# Patient Record
Sex: Female | Born: 1990 | Race: White | Hispanic: No | Marital: Married | Smoking: Never smoker
Health system: Southern US, Community
[De-identification: ages and names within clinical notes are randomized; demographics above are authoritative.]

## PROBLEM LIST (undated history)

## (undated) ENCOUNTER — Inpatient Hospital Stay (HOSPITAL_COMMUNITY): Payer: Self-pay

## (undated) DIAGNOSIS — Q751 Craniofacial dysostosis: Secondary | ICD-10-CM

## (undated) DIAGNOSIS — R519 Headache, unspecified: Secondary | ICD-10-CM

## (undated) DIAGNOSIS — R87629 Unspecified abnormal cytological findings in specimens from vagina: Secondary | ICD-10-CM

## (undated) DIAGNOSIS — R51 Headache: Secondary | ICD-10-CM

## (undated) HISTORY — PX: NO PAST SURGERIES: SHX2092

## (undated) HISTORY — PX: WISDOM TOOTH EXTRACTION: SHX21

## (undated) HISTORY — PX: OTHER SURGICAL HISTORY: SHX169

---

## 2014-07-06 ENCOUNTER — Ambulatory Visit (HOSPITAL_COMMUNITY): Payer: Self-pay

## 2014-11-05 ENCOUNTER — Emergency Department (HOSPITAL_BASED_OUTPATIENT_CLINIC_OR_DEPARTMENT_OTHER): Payer: BLUE CROSS/BLUE SHIELD

## 2014-11-05 ENCOUNTER — Emergency Department (HOSPITAL_BASED_OUTPATIENT_CLINIC_OR_DEPARTMENT_OTHER)
Admission: EM | Admit: 2014-11-05 | Discharge: 2014-11-05 | Disposition: A | Discharge status: Home / Self Care | Payer: BLUE CROSS/BLUE SHIELD | Attending: Emergency Medicine | Admitting: Emergency Medicine

## 2014-11-05 ENCOUNTER — Encounter (HOSPITAL_BASED_OUTPATIENT_CLINIC_OR_DEPARTMENT_OTHER): Payer: Self-pay | Admitting: *Deleted

## 2014-11-05 DIAGNOSIS — Y998 Other external cause status: Secondary | ICD-10-CM | POA: Insufficient documentation

## 2014-11-05 DIAGNOSIS — Y9289 Other specified places as the place of occurrence of the external cause: Secondary | ICD-10-CM | POA: Insufficient documentation

## 2014-11-05 DIAGNOSIS — R55 Syncope and collapse: Secondary | ICD-10-CM

## 2014-11-05 DIAGNOSIS — S82844A Nondisplaced bimalleolar fracture of right lower leg, initial encounter for closed fracture: Secondary | ICD-10-CM | POA: Insufficient documentation

## 2014-11-05 DIAGNOSIS — S82891A Other fracture of right lower leg, initial encounter for closed fracture: Secondary | ICD-10-CM

## 2014-11-05 DIAGNOSIS — Y9389 Activity, other specified: Secondary | ICD-10-CM | POA: Insufficient documentation

## 2014-11-05 DIAGNOSIS — W1839XA Other fall on same level, initial encounter: Secondary | ICD-10-CM | POA: Insufficient documentation

## 2014-11-05 LAB — I-STAT CHEM 8, ED
BUN: 14 mg/dL (ref 6–20)
Calcium, Ion: 1.2 mmol/L (ref 1.12–1.23)
Chloride: 103 mmol/L (ref 101–111)
Creatinine, Ser: 0.8 mg/dL (ref 0.44–1.00)
Glucose, Bld: 96 mg/dL (ref 65–99)
HEMATOCRIT: 33 % — AB (ref 36.0–46.0)
HEMOGLOBIN: 11.2 g/dL — AB (ref 12.0–15.0)
Potassium: 3.7 mmol/L (ref 3.5–5.1)
Sodium: 140 mmol/L (ref 135–145)
TCO2: 24 mmol/L (ref 0–100)

## 2014-11-05 MED ORDER — HYDROCODONE-ACETAMINOPHEN 5-325 MG PO TABS
1.0000 | ORAL_TABLET | Freq: Once | ORAL | Status: AC
  Administered 2014-11-05: 1 via ORAL
  Filled 2014-11-05: qty 1

## 2014-11-05 MED ORDER — IBUPROFEN 400 MG PO TABS
400.0000 mg | ORAL_TABLET | Freq: Once | ORAL | Status: AC
  Administered 2014-11-05: 400 mg via ORAL
  Filled 2014-11-05 (×2): qty 1

## 2014-11-05 MED ORDER — HYDROCODONE-ACETAMINOPHEN 5-325 MG PO TABS: 1.0000 | ORAL_TABLET | Freq: Four times a day (QID) | ORAL | Status: DC | PRN

## 2014-11-05 NOTE — Discharge Instructions (Signed)
If you were given medicines take as directed.  If you are on coumadin or contraceptives realize their levels and effectiveness is altered by many different medicines.  If you have any reaction (rash, tongues swelling, other) to the medicines stop taking and see a physician.   Non weight bearing.   If your blood pressure was elevated in the ER make sure you follow up for management with a primary doctor or return for chest pain, shortness of breath or stroke symptoms.  Please follow up as directed and return to the ER or see a physician for new or worsening symptoms.  Thank you. Filed Vitals:   11/05/14 1733  BP: 108/67  Pulse: 84  Temp: 97.6 F (36.4 C)  TempSrc: Oral  Resp: 18  Height:  (1.702 m)  Weight: 135 lb (61.236 kg)  SpO2: 98%    Ankle Fracture A fracture is a break in a bone. A cast or splint may be used to protect the ankle and heal the break. Sometimes, surgery is needed. HOME CARE  Use crutches as told by your doctor. It is very important that you use your crutches correctly.  Do not put weight or pressure on the injured ankle until told by your doctor.  Keep your ankle raised (elevated) when sitting or lying down.  Apply ice to the ankle:  Put ice in a plastic bag.  Place a towel between your cast and the bag.  Leave the ice on for 20 minutes, 2-3 times a day.  If you have a plaster or fiberglass cast:  Do not try to scratch under the cast with any objects.  Check the skin around the cast every day. You may put lotion on red or sore areas.  Keep your cast dry and clean.  If you have a plaster splint:  Wear the splint as told by your doctor.  You can loosen the elastic around the splint if your toes get numb, tingle, or turn cold or blue.  Do not put pressure on any part of your cast or splint. It may break. Rest your plaster splint or cast only on a pillow the first 24 hours until it is fully hardened.  Cover your cast or splint with a plastic  bag during showers.  Do not lower your cast or splint into water.  Take medicine as told by your doctor.  Do not drive until your doctor says it is safe.  Follow-up with your doctor as told. It is very important that you go to your follow-up visits. GET HELP IF: The swelling and discomfort gets worse.  GET HELP RIGHT AWAY IF:   Your splint or cast breaks.  You continue to have very bad pain.  You have new pain or swelling after your splint or cast was put on.  Your skin or toes below the injured ankle:  Turn blue or gray.  Feel cold, numb, or you cannot feel them.  There is a bad smell or yellowish white fluid (pus) coming from under the splint or cast. MAKE SURE YOU:   Understand these instructions.  Will watch your condition.  Will get help right away if you are not doing well or get worse. Document Released: 01/19/2009 Document Revised: 01/12/2013 Document Reviewed: 10/21/2012 [facility] Patient Information 2015 [place], [place]. This information is not intended to replace advice given to you by your health care provider. Make sure you discuss any questions you have with your health care provider.

## 2014-11-05 NOTE — ED Notes (Signed)
Pt reports being outside are [employer] all day, feeling nauseated and then having a syncopal episode.  Pt ambulatory at this time.  Reports that she fell because she was dizzy, twisting her right ankle.  Significant swelling noted to ankle.  Pulses found with doppler

## 2014-11-05 NOTE — ED Provider Notes (Signed)
CSN: [id number]     Arrival date & time 11/05/14  1715 History  This chart was scribed for Blane Ohara, MD by Budd Palmer, ED Scribe. This patient was seen in room MHT13/MHT13 and the patient's care was started at 5:56 PM.    Chief Complaint  Patient presents with  . Near Syncope   The history is provided by the patient. No language interpreter was used.   HPI Comments: Latoya Martin is a 24 y.o. female who presents to the Emergency Department complaining of near syncope earlier today. She was at [employer] all day feeling a little nauseous with the urge to go to the bathroom when she passed out and woke back up on the ground. She notes associated right ankle pain and swelling. She has since been drinking a lot of water and Powerade. She was unable to walk more than 15 steps because she felt as though she might loose consciousness again. She has never had any previous episodes of these Sx. She is currently on her period. She denies a PMHx of heart issues and DVT. She denies a FHx of sudden death at a young age.   History reviewed. No pertinent past medical history. History reviewed. No pertinent past surgical history. History reviewed. No pertinent family history. History  Substance Use Topics  . Smoking status: Never Smoker   . Smokeless tobacco: Not on file  . Alcohol Use: No   OB History    No data available     Review of Systems  All other systems reviewed and are negative.   Allergies  Review of patient's allergies indicates no known allergies.  Home Medications   Prior to Admission medications   Not on File   BP 108/67 mmHg  Pulse 84  Temp(Src) 97.6 F (36.4 C) (Oral)  Resp 18  Ht  (1.702 m)  Wt 135 lb (61.236 kg)  BMI 21.14 kg/m2  SpO2 98%  LMP 10/05/2014 Physical Exam  Constitutional: She is oriented to person, place, and time. She appears well-developed and well-nourished. No distress.  HENT:  Head: Normocephalic and atraumatic.  Mouth/Throat:  Oropharynx is clear and moist.  Eyes: Conjunctivae and EOM are normal. Pupils are equal, round, and reactive to light.  Neck: Normal range of motion. Neck supple. No tracheal deviation present.  Cardiovascular: Normal rate and regular rhythm.   No murmur heard. 2+ UE pulses  Pulmonary/Chest: Breath sounds normal. No respiratory distress.  Abdominal: Soft.  Musculoskeletal: Normal range of motion.  Moderate swelling and TTP on R ankle.  Neurological: She is alert and oriented to person, place, and time.  Skin: Skin is warm and dry.  Psychiatric: She has a normal mood and affect. Her behavior is normal.  Nursing note and vitals reviewed.   ED Course  Procedures  DIAGNOSTIC STUDIES: Oxygen Saturation is 98% on RA, normal by my interpretation.    COORDINATION OF CARE: 6:02 PM - Discussed plans to wait on diagnostic studies and reassess. Pt advised of plan for treatment and pt agrees.  Labs Review Labs Reviewed  I-STAT CHEM 8, ED - Abnormal; Notable for the following:    Hemoglobin 11.2 (*)    HCT 33.0 (*)    All other components within normal limits  POC URINE PREG, ED    Imaging Review Dg Ankle Complete Right  11/05/2014   CLINICAL DATA:  Fall injuring the RIGHT ankle. Medial and lateral ankle pain with swelling.  EXAM: RIGHT ANKLE - COMPLETE 3+ VIEW  COMPARISON:  None.  FINDINGS: The ankle mortise remains congruent. Talar dome is intact. There is a nondisplaced oblique fracture of the distal fibular metaphysis best seen on the frontal view. Additionally, there is a nondisplaced posterior malleolar fracture evident on the lateral view.  13 mm bone fragment is present over the lateral tibial plafond on the lateral projection, with defect in the lateral tibial plafond.  IMPRESSION: 1. Nondisplaced distal fibular metaphysis fracture. 2. Nondisplaced posterior malleolus fracture. 3. Lateral tibial plafond fracture displaced anteriorly over the joint. A noncontrast CT the ankle should be  considered for further assessment and operative planning.   Electronically Signed   By: Andreas Newport M.D.   On: 11/05/2014 17:55   Ct Ankle Right Wo Contrast  11/05/2014   CLINICAL DATA:  Syncopal episode with fall twisting the right ankle.  EXAM: CT OF THE RIGHT ANKLE WITHOUT CONTRAST  TECHNIQUE: Multidetector CT imaging of the right ankle was performed according to the standard protocol. Multiplanar CT image reconstructions were also generated.  COMPARISON:  Plain films earlier today.  FINDINGS: Examination demonstrates diffuse subcutaneous edema over the ankle.  There is a oblique fracture of the distal fibular metaphysis without significant displacement. There is a displaced fracture of the anterior aspect of the lateral tibial plafond with 2 adjacent displaced fragments anterior laterally measuring 8 and 12 mm respectively. There is a posterior malleolar fracture without significant displacement. There is also a subtle cortical fracture along the superior aspect of the medial malleolus without significant displacement. Os trigonum is present.  There are degenerative changes over the calcaneonavicular joint. Remainder the exam is within normal.  IMPRESSION: Oblique fracture of the distal fibular metaphysis without significant displacement. Posterior malleolar fracture without significant displacement. Superficial cortical fracture of the superior aspect of the medial malleolus without significant displacement. Displaced chip fracture from the anterior aspect of the lateral tibial plafond with 2 adjacent anterior laterally displaced 8 mm and 12 mm fragments. Associated subcutaneous edema.   Electronically Signed   By: Elberta Fortis M.D.   On: 11/05/2014 18:36     EKG Interpretation None      MDM   Final diagnoses:  Syncope and collapse  Closed right ankle fracture, initial encounter   Patient presented after syncopal episode, nauseated and had sense of urination prior. Likely vasovagal however  discussed close outpatient follow-up and if recurrence patient will need a Holter monitor. No classic blood clot risk factors. X-ray reviewed showing ankle fracture, CT ordered for further delineation to help outpatient surgery. Splint placed in the ER. Pain controlled in the ER.  Results and differential diagnosis were discussed with the patient/parent/guardian. Xrays were independently reviewed by myself.  Close follow up outpatient was discussed, comfortable with the plan.   Medications - No data to display  Filed Vitals:   11/05/14 1733  BP: 108/67  Pulse: 84  Temp: 97.6 F (36.4 C)  TempSrc: Oral  Resp: 18  Height: 5\' 7"  (1.702 m)  Weight: 135 lb (61.236 kg)  SpO2: 98%    Final diagnoses:  Syncope and collapse  Closed right ankle fracture, initial encounter       Blane Ohara, MD 11/05/14 [phone]

## 2015-04-04 LAB — OB RESULTS CONSOLE GC/CHLAMYDIA
Chlamydia: NEGATIVE
GC PROBE AMP, GENITAL: NEGATIVE

## 2015-04-04 LAB — OB RESULTS CONSOLE ANTIBODY SCREEN: ANTIBODY SCREEN: NEGATIVE

## 2015-04-04 LAB — OB RESULTS CONSOLE ABO/RH: RH Type: POSITIVE

## 2015-04-04 LAB — OB RESULTS CONSOLE RPR: RPR: NONREACTIVE

## 2015-04-04 LAB — OB RESULTS CONSOLE RUBELLA ANTIBODY, IGM: Rubella: NON-IMMUNE/NOT IMMUNE

## 2015-04-04 LAB — OB RESULTS CONSOLE HIV ANTIBODY (ROUTINE TESTING): HIV: NONREACTIVE

## 2015-04-04 LAB — OB RESULTS CONSOLE HEPATITIS B SURFACE ANTIGEN: HEP B S AG: NEGATIVE

## 2015-04-04 LAB — OB RESULTS CONSOLE GBS: GBS: NEGATIVE

## 2015-07-30 DIAGNOSIS — D509 Iron deficiency anemia, unspecified: Secondary | ICD-10-CM | POA: Diagnosis not present

## 2015-07-30 DIAGNOSIS — O4402 Placenta previa specified as without hemorrhage, second trimester: Secondary | ICD-10-CM | POA: Diagnosis not present

## 2015-07-30 DIAGNOSIS — Z3A26 26 weeks gestation of pregnancy: Secondary | ICD-10-CM | POA: Diagnosis not present

## 2015-07-30 DIAGNOSIS — Z36 Encounter for antenatal screening of mother: Secondary | ICD-10-CM | POA: Diagnosis not present

## 2015-07-30 DIAGNOSIS — O358XX0 Maternal care for other (suspected) fetal abnormality and damage, not applicable or unspecified: Secondary | ICD-10-CM | POA: Diagnosis not present

## 2015-08-06 DIAGNOSIS — O99810 Abnormal glucose complicating pregnancy: Secondary | ICD-10-CM | POA: Diagnosis not present

## 2015-08-06 DIAGNOSIS — Z3A27 27 weeks gestation of pregnancy: Secondary | ICD-10-CM | POA: Diagnosis not present

## 2015-08-13 DIAGNOSIS — D509 Iron deficiency anemia, unspecified: Secondary | ICD-10-CM | POA: Diagnosis not present

## 2015-09-10 DIAGNOSIS — O4403 Placenta previa specified as without hemorrhage, third trimester: Secondary | ICD-10-CM | POA: Diagnosis not present

## 2015-09-10 DIAGNOSIS — Z3A32 32 weeks gestation of pregnancy: Secondary | ICD-10-CM | POA: Diagnosis not present

## 2015-09-12 ENCOUNTER — Inpatient Hospital Stay (HOSPITAL_COMMUNITY)
Admission: AD | Admit: 2015-09-12 | Discharge: 2015-09-12 | Disposition: A | Discharge status: Home / Self Care | Payer: BLUE CROSS/BLUE SHIELD | Source: Ambulatory Visit | Attending: Obstetrics & Gynecology | Admitting: Obstetrics & Gynecology

## 2015-09-12 ENCOUNTER — Encounter (HOSPITAL_COMMUNITY): Payer: Self-pay | Admitting: *Deleted

## 2015-09-12 DIAGNOSIS — Z3A32 32 weeks gestation of pregnancy: Secondary | ICD-10-CM | POA: Insufficient documentation

## 2015-09-12 DIAGNOSIS — O26893 Other specified pregnancy related conditions, third trimester: Secondary | ICD-10-CM | POA: Diagnosis not present

## 2015-09-12 DIAGNOSIS — O219 Vomiting of pregnancy, unspecified: Secondary | ICD-10-CM

## 2015-09-12 DIAGNOSIS — R51 Headache: Secondary | ICD-10-CM | POA: Insufficient documentation

## 2015-09-12 DIAGNOSIS — O9989 Other specified diseases and conditions complicating pregnancy, childbirth and the puerperium: Secondary | ICD-10-CM | POA: Diagnosis not present

## 2015-09-12 DIAGNOSIS — R1011 Right upper quadrant pain: Secondary | ICD-10-CM | POA: Insufficient documentation

## 2015-09-12 DIAGNOSIS — O210 Mild hyperemesis gravidarum: Secondary | ICD-10-CM | POA: Diagnosis not present

## 2015-09-12 HISTORY — DX: Craniofacial dysostosis: Q75.1

## 2015-09-12 LAB — COMPREHENSIVE METABOLIC PANEL
ALK PHOS: 107 U/L (ref 38–126)
ALT: 17 U/L (ref 14–54)
ANION GAP: 7 (ref 5–15)
AST: 19 U/L (ref 15–41)
Albumin: 2.9 g/dL — ABNORMAL LOW (ref 3.5–5.0)
BUN: 8 mg/dL (ref 6–20)
CO2: 20 mmol/L — AB (ref 22–32)
Calcium: 8.2 mg/dL — ABNORMAL LOW (ref 8.9–10.3)
Chloride: 108 mmol/L (ref 101–111)
Creatinine, Ser: 0.49 mg/dL (ref 0.44–1.00)
GFR calc Af Amer: >60 mL/min (ref >60)
GFR calc non Af Amer: >60 mL/min (ref >60)
Glucose, Bld: 107 mg/dL — ABNORMAL HIGH (ref 65–99)
POTASSIUM: 3.7 mmol/L (ref 3.5–5.1)
SODIUM: 135 mmol/L (ref 135–145)
Total Bilirubin: 0.5 mg/dL (ref 0.3–1.2)
Total Protein: 6.4 g/dL — ABNORMAL LOW (ref 6.5–8.1)

## 2015-09-12 LAB — URINALYSIS, ROUTINE W REFLEX MICROSCOPIC
Bilirubin Urine: NEGATIVE
GLUCOSE, UA: NEGATIVE mg/dL
Hgb urine dipstick: NEGATIVE
Ketones, ur: NEGATIVE mg/dL
LEUKOCYTES UA: NEGATIVE
Nitrite: NEGATIVE
PROTEIN: NEGATIVE mg/dL
Specific Gravity, Urine: 1.01 (ref 1.005–1.030)
pH: 7 (ref 5.0–8.0)

## 2015-09-12 LAB — WET PREP, GENITAL
CLUE CELLS WET PREP: NONE SEEN
Trich, Wet Prep: NONE SEEN
Yeast Wet Prep HPF POC: NONE SEEN

## 2015-09-12 LAB — CBC
HEMATOCRIT: 30.6 % — AB (ref 36.0–46.0)
HEMOGLOBIN: 10.5 g/dL — AB (ref 12.0–15.0)
MCH: 30.1 pg (ref 26.0–34.0)
MCHC: 34.3 g/dL (ref 30.0–36.0)
MCV: 87.7 fL (ref 78.0–100.0)
Platelets: 157 10*3/uL (ref 150–400)
RBC: 3.49 MIL/uL — ABNORMAL LOW (ref 3.87–5.11)
RDW: 14.2 % (ref 11.5–15.5)
WBC: 10.3 10*3/uL (ref 4.0–10.5)

## 2015-09-12 MED ORDER — ACETAMINOPHEN 325 MG PO TABS
650.0000 mg | ORAL_TABLET | Freq: Once | ORAL | Status: AC
  Administered 2015-09-12: 650 mg via ORAL
  Filled 2015-09-12: qty 2

## 2015-09-12 MED ORDER — HYDROCODONE-ACETAMINOPHEN 5-325 MG PO TABS: 1.0000 | ORAL_TABLET | ORAL | Status: DC | PRN

## 2015-09-12 MED ORDER — ONDANSETRON 4 MG PO TBDP: 8.0000 mg | ORAL_TABLET | Freq: Three times a day (TID) | ORAL | Status: DC | PRN

## 2015-09-12 NOTE — MAU Note (Signed)
Around 2330, woke up out of deep sleep, felt really bad.  Vomited.  Started having pain in RUQ, felt like ribs were "on fire".,  At 7 this morning, was sick again.  Called office x2, finally heard back, was told to come here.  appt on Mon, ? AFI was 9.. Has a pounding headache; denies visual changes or increased swelling.+

## 2015-09-12 NOTE — MAU Provider Note (Signed)
History     CSN: [id number]  Arrival date and time: 09/12/15 1439   First Provider Initiated Contact with Patient 09/12/15 1539      Chief Complaint  Patient presents with  . Abdominal Pain  . Headache   HPI  Latoya Martin is a 25 y.o. G1P0 at [redacted]w[redacted]d who presents for RUQ, n/v, headache, & vaginal discharge.  Reports RUQ pain just under her ribs x 2 weeks. Discussed with her ob on Monday who told her it was "stretching pain". Pain worsened last night & was associated with nausea & vomiting. Reports vomiting until 7 am this morning, multiple times. Was able to eat a sandwich for lunch. Rates pain 8/10. Describes as constant burning. Pain worse with palpation & deep breaths. Is not currently nauseated. Last night ate fried fish, french fries, and birthday cake.  Headache since last night. Rates 5/10. Has not treated.  Reports increase in thin discharge x 2 days and lower abdominal pressure.  Had intercourse this morning.  Denies vaginal bleeding or contractions.  Positive fetal movement.   OB History    Gravida Para Term Preterm AB TAB SAB Ectopic Multiple Living   1               Past Medical History  Diagnosis Date  . Crouzon syndrome     Past Surgical History  Procedure Laterality Date  . Head surgery       crouzon syndrome  . Wisdom tooth extraction      History reviewed. No pertinent family history.  Social History  Substance Use Topics  . Smoking status: Never Smoker   . Smokeless tobacco: None  . Alcohol Use: No    Allergies: No Known Allergies  Prescriptions prior to admission  Medication Sig Dispense Refill Last Dose  . Prenatal Vit-Fe Fumarate-FA (MULTIVITAMIN-PRENATAL) 27-0.8 MG TABS tablet Take 1 tablet by mouth daily at 12 noon.   09/11/2015 at Unknown time    Review of Systems  Constitutional: Negative.   Eyes: Negative for blurred vision.  Respiratory: Negative.   Cardiovascular: Negative.   Gastrointestinal: Positive for nausea, vomiting and  abdominal pain. Negative for heartburn, diarrhea and constipation.  Genitourinary: Negative for dysuria.       + vaginal discharge vs LOF  Neurological: Positive for headaches.   Physical Exam   Blood pressure 112/69, pulse 102, temperature 98.1 F (36.7 C), temperature source Oral, resp. rate 20, weight 173 lb 3.2 oz (78.563 kg), last menstrual period 10/05/2014. Patient Vitals for the past 24 hrs:  BP Temp Temp src Pulse Resp Weight  09/12/15 1721 116/62 mmHg 97.7 F (36.5 C) Oral 86 18 -  09/12/15 1530 108/68 mmHg - - 94 - -  09/12/15 1524 111/65 mmHg - - 99 - -  09/12/15 1508 112/69 mmHg 98.1 F (36.7 C) Oral 102 20 173 lb 3.2 oz (78.563 kg)     Physical Exam  Nursing note and vitals reviewed. Constitutional: She is oriented to person, place, and time. She appears well-developed and well-nourished. No distress.  HENT:  Head: Normocephalic and atraumatic.  Eyes: Conjunctivae are normal. Right eye exhibits no discharge. Left eye exhibits no discharge. No scleral icterus.  Neck: Normal range of motion.  Cardiovascular: Normal rate, regular rhythm and normal heart sounds.   No murmur heard. Respiratory: Effort normal and breath sounds normal. No respiratory distress. She has no wheezes.  GI: Soft. Bowel sounds are normal. There is tenderness. There is positive Murphy's sign. There is no guarding.  Genitourinary: Vaginal discharge (moderate amount of thin white discharge) found.  No pooling  Neurological: She is alert and oriented to person, place, and time.  Skin: Skin is warm and dry. She is not diaphoretic.  Psychiatric: She has a normal mood and affect. Her behavior is normal. Judgment and thought content normal.   Dilation: Closed Effacement (%): Thick Exam by:: Judeth HornErin Puneet Masoner NP   Fetal Tracing:  Baseline: 145 Variability: moderate Accelerations: 15x15 Decelerations: none  Toco: none   MAU Course  Procedures Results for orders placed or performed during the  hospital encounter of 09/12/15 (from the past 24 hour(s))  Urinalysis, Routine w reflex microscopic (not at [facility])     Status: None   Collection Time: 09/12/15  3:10 PM  Result Value Ref Range   Color, Urine YELLOW YELLOW   APPearance CLEAR CLEAR   Specific Gravity, Urine 1.010 1.005 - 1.030   pH 7.0 5.0 - 8.0   Glucose, UA NEGATIVE NEGATIVE mg/dL   Hgb urine dipstick NEGATIVE NEGATIVE   Bilirubin Urine NEGATIVE NEGATIVE   Ketones, ur NEGATIVE NEGATIVE mg/dL   Protein, ur NEGATIVE NEGATIVE mg/dL   Nitrite NEGATIVE NEGATIVE   Leukocytes, UA NEGATIVE NEGATIVE  CBC     Status: Abnormal   Collection Time: 09/12/15  3:25 PM  Result Value Ref Range   WBC 10.3 4.0 - 10.5 K/uL   RBC 3.49 (L) 3.87 - 5.11 MIL/uL   Hemoglobin 10.5 (L) 12.0 - 15.0 g/dL   HCT 65.730.6 (L) 84.636.0 - 96.246.0 %   MCV 87.7 78.0 - 100.0 fL   MCH 30.1 26.0 - 34.0 pg   MCHC 34.3 30.0 - 36.0 g/dL   RDW 95.214.2 84.111.5 - 32.415.5 %   Platelets 157 150 - 400 K/uL  Comprehensive metabolic panel     Status: Abnormal   Collection Time: 09/12/15  3:25 PM  Result Value Ref Range   Sodium 135 135 - 145 mmol/L   Potassium 3.7 3.5 - 5.1 mmol/L   Chloride 108 101 - 111 mmol/L   CO2 20 (L) 22 - 32 mmol/L   Glucose, Bld 107 (H) 65 - 99 mg/dL   BUN 8 6 - 20 mg/dL   Creatinine, Ser 4.010.49 0.44 - 1.00 mg/dL   Calcium 8.2 (L) 8.9 - 10.3 mg/dL   Total Protein 6.4 (L) 6.5 - 8.1 g/dL   Albumin 2.9 (L) 3.5 - 5.0 g/dL   AST 19 15 - 41 U/L   ALT 17 14 - 54 U/L   Alkaline Phosphatase 107 38 - 126 U/L   Total Bilirubin 0.5 0.3 - 1.2 mg/dL   GFR calc non Af Amer >60 >60 mL/min   GFR calc Af Amer >60 >60 mL/min   Anion gap 7 5 - 15  Wet prep, genital     Status: Abnormal   Collection Time: 09/12/15  3:57 PM  Result Value Ref Range   Yeast Wet Prep HPF POC NONE SEEN NONE SEEN   Trich, Wet Prep NONE SEEN NONE SEEN   Clue Cells Wet Prep HPF POC NONE SEEN NONE SEEN   WBC, Wet Prep HPF POC MODERATE (A) NONE SEEN   Sperm PRESENT     MDM Reactive  tracing, no contractions, cervix closed No pooling, fern negative BPs normal Tylenol 650 mg PO -- reports resolution of headache -- continues to have RUQ pain CBC, CMP -- wnl S/w Dr. Langston MaskerMorris. No evidence of acute or emergent condition. Ok to discharge home. Can rx vicodin for pain.  Assessment and Plan  A: 1. RUQ abdominal pain   2. Nausea/vomiting in pregnancy     P: Discharge home Rx vicodin & zofran Discussed reasons to return to MAU Keep f/u with ob Decrease high fat & fried foods in diet  Judeth Horn 09/12/2015, 3:29 PM

## 2015-09-12 NOTE — MAU Note (Signed)
Pt put back in the system by error, pain score just restated

## 2015-09-12 NOTE — Discharge Instructions (Signed)
Low-Fat Diet for Pancreatitis or Gallbladder Conditions A low-fat diet can be helpful if you have pancreatitis or a gallbladder condition. With these conditions, your pancreas and gallbladder have trouble digesting fats. A healthy eating plan with less fat will help rest your pancreas and gallbladder and reduce your symptoms. WHAT DO I NEED TO KNOW ABOUT THIS DIET?  Eat a low-fat diet.  Reduce your fat intake to less than 20-30% of your total daily calories. This is less than 50-60 g of fat per day.  Remember that you need some fat in your diet. Ask your dietician what your daily goal should be.  Choose nonfat and low-fat healthy foods. Look for the words "nonfat," "low fat," or "fat free."  As a guide, look on the label and choose foods with less than 3 g of fat per serving. Eat only one serving.  Avoid alcohol.  Do not smoke. If you need help quitting, talk with your health care provider.  Eat small frequent meals instead of three large heavy meals. WHAT FOODS CAN I EAT? Grains Include healthy grains and starches such as potatoes, wheat bread, fiber-rich cereal, and brown rice. Choose whole grain options whenever possible. In adults, whole grains should account for 45-65% of your daily calories.  Fruits and Vegetables Eat plenty of fruits and vegetables. Fresh fruits and vegetables add fiber to your diet. Meats and Other Protein Sources Eat lean meat such as chicken and pork. Trim any fat off of meat before cooking it. Eggs, fish, and beans are other sources of protein. In adults, these foods should account for 10-35% of your daily calories. Dairy Choose low-fat milk and dairy options. Dairy includes fat and protein, as well as calcium.  Fats and Oils Limit high-fat foods such as fried foods, sweets, baked goods, sugary drinks.  Other Creamy sauces and condiments, such as mayonnaise, can add extra fat. Think about whether or not you need to use them, or use smaller amounts or low fat  options. WHAT FOODS ARE NOT RECOMMENDED?  High fat foods, such as:  [employer].  Ice cream.  [place] toast.  Sweet rolls.  Pizza.  Cheese bread.  Foods covered with batter, butter, creamy sauces, or cheese.  Fried foods.  Sugary drinks and desserts.  Foods that cause gas or bloating   This information is not intended to replace advice given to you by your health care provider. Make sure you discuss any questions you have with your health care provider.   Document Released: 03/29/2013 Document Reviewed: 03/29/2013 Elsevier Interactive Patient Education [employer] Preterm Labor Information Preterm labor is when labor starts before you are [redacted] weeks pregnant. The normal length of pregnancy is 39 to 41 weeks.  CAUSES  The cause of preterm labor is not often known. The most common known cause is infection. RISK FACTORS  Having a history of preterm labor.  Having your water break before it should.  Having a placenta that covers the opening of the cervix.  Having a placenta that breaks away from the uterus.  Having a cervix that is too weak to hold the baby in the uterus.  Having too much fluid in the amniotic sac.  Taking drugs or smoking while pregnant.  Not gaining enough weight while pregnant.  Being younger than 32 and older than 26 years old.  Having a low income.  Being African American. SYMPTOMS  Period-like cramps, belly (abdominal) pain, or back pain.  Contractions that are regular, as often as six  in an hour. They may be mild or painful.  Contractions that start at the top of the belly. They then move to the lower belly and back.  Lower belly pressure that seems to get stronger.  Bleeding from the vagina.  Fluid leaking from the vagina. TREATMENT  Treatment depends on:  Your condition.  The condition of your baby.  How many weeks pregnant you are. Your doctor may have you:  Take medicine to stop contractions.  Stay in bed  except to use the restroom (bed rest).  Stay in the hospital. WHAT SHOULD YOU DO IF YOU THINK YOU ARE IN PRETERM LABOR? Call your doctor right away. You need to go to the hospital right away.  HOW CAN YOU PREVENT PRETERM LABOR IN FUTURE PREGNANCIES?  Stop smoking, if you smoke.  Maintain healthy weight gain.  Do not take drugs or be around chemicals that are not needed.  Tell your doctor if you think you have an infection.  Tell your doctor if you had a preterm labor before.   This information is not intended to replace advice given to you by your health care provider. Make sure you discuss any questions you have with your health care provider.   Document Released: 06/20/2008 Document Revised: 08/08/2014 Document Reviewed: 04/26/2012 Elsevier Interactive Patient Education [employer] Elsevier Inc[employer]

## 2015-09-12 NOTE — MAU Note (Signed)
No diarrhea today, last emesis was 0700

## 2015-09-18 DIAGNOSIS — Z3A33 33 weeks gestation of pregnancy: Secondary | ICD-10-CM | POA: Diagnosis not present

## 2015-09-18 DIAGNOSIS — O4103X0 Oligohydramnios, third trimester, not applicable or unspecified: Secondary | ICD-10-CM | POA: Diagnosis not present

## 2015-10-01 DIAGNOSIS — Z3A35 35 weeks gestation of pregnancy: Secondary | ICD-10-CM | POA: Diagnosis not present

## 2015-10-01 DIAGNOSIS — Z36 Encounter for antenatal screening of mother: Secondary | ICD-10-CM | POA: Diagnosis not present

## 2015-10-01 DIAGNOSIS — Z113 Encounter for screening for infections with a predominantly sexual mode of transmission: Secondary | ICD-10-CM | POA: Diagnosis not present

## 2015-10-01 DIAGNOSIS — O26892 Other specified pregnancy related conditions, second trimester: Secondary | ICD-10-CM | POA: Diagnosis not present

## 2015-10-07 ENCOUNTER — Inpatient Hospital Stay (HOSPITAL_COMMUNITY)
Admission: AD | Admit: 2015-10-07 | Discharge: 2015-10-10 | DRG: 775 | Disposition: A | Discharge status: Home / Self Care | Payer: BLUE CROSS/BLUE SHIELD | Source: Ambulatory Visit | Attending: Obstetrics and Gynecology | Admitting: Obstetrics and Gynecology

## 2015-10-07 DIAGNOSIS — Q751 Craniofacial dysostosis: Secondary | ICD-10-CM

## 2015-10-07 DIAGNOSIS — Z3A36 36 weeks gestation of pregnancy: Secondary | ICD-10-CM

## 2015-10-07 DIAGNOSIS — O42013 Preterm premature rupture of membranes, onset of labor within 24 hours of rupture, third trimester: Principal | ICD-10-CM | POA: Diagnosis present

## 2015-10-07 HISTORY — DX: Headache, unspecified: R51.9

## 2015-10-07 HISTORY — DX: Unspecified abnormal cytological findings in specimens from vagina: R87.629

## 2015-10-07 HISTORY — DX: Headache: R51

## 2015-10-08 ENCOUNTER — Inpatient Hospital Stay (HOSPITAL_COMMUNITY): Payer: BLUE CROSS/BLUE SHIELD | Admitting: Anesthesiology

## 2015-10-08 ENCOUNTER — Encounter (HOSPITAL_COMMUNITY): Payer: Self-pay | Admitting: *Deleted

## 2015-10-08 DIAGNOSIS — Q751 Craniofacial dysostosis: Secondary | ICD-10-CM | POA: Diagnosis not present

## 2015-10-08 DIAGNOSIS — Z23 Encounter for immunization: Secondary | ICD-10-CM | POA: Diagnosis not present

## 2015-10-08 DIAGNOSIS — Z3A36 36 weeks gestation of pregnancy: Secondary | ICD-10-CM | POA: Diagnosis not present

## 2015-10-08 DIAGNOSIS — O42013 Preterm premature rupture of membranes, onset of labor within 24 hours of rupture, third trimester: Secondary | ICD-10-CM | POA: Diagnosis not present

## 2015-10-08 LAB — CBC
HCT: 30.5 % — ABNORMAL LOW (ref 36.0–46.0)
Hemoglobin: 10.5 g/dL — ABNORMAL LOW (ref 12.0–15.0)
MCH: 29.5 pg (ref 26.0–34.0)
MCHC: 34.4 g/dL (ref 30.0–36.0)
MCV: 85.7 fL (ref 78.0–100.0)
PLATELETS: 162 10*3/uL (ref 150–400)
RBC: 3.56 MIL/uL — AB (ref 3.87–5.11)
RDW: 14.6 % (ref 11.5–15.5)
WBC: 9.5 10*3/uL (ref 4.0–10.5)

## 2015-10-08 LAB — TYPE AND SCREEN
ABO/RH(D): A POS
ANTIBODY SCREEN: NEGATIVE

## 2015-10-08 LAB — RPR: RPR Ser Ql: NONREACTIVE

## 2015-10-08 LAB — POCT FERN TEST: POCT Fern Test: POSITIVE

## 2015-10-08 LAB — ABO/RH: ABO/RH(D): A POS

## 2015-10-08 MED ORDER — SIMETHICONE 80 MG PO CHEW: 80.0000 mg | CHEWABLE_TABLET | ORAL | Status: DC | PRN

## 2015-10-08 MED ORDER — LACTATED RINGERS IV SOLN
500.0000 mL | Freq: Once | INTRAVENOUS | Status: AC
  Administered 2015-10-08: 500 mL via INTRAVENOUS

## 2015-10-08 MED ORDER — ONDANSETRON HCL 4 MG PO TABS: 4.0000 mg | ORAL_TABLET | ORAL | Status: DC | PRN

## 2015-10-08 MED ORDER — ONDANSETRON HCL 4 MG/2ML IJ SOLN: 4.0000 mg | Freq: Four times a day (QID) | INTRAMUSCULAR | Status: DC | PRN

## 2015-10-08 MED ORDER — SENNOSIDES-DOCUSATE SODIUM 8.6-50 MG PO TABS
2.0000 | ORAL_TABLET | ORAL | Status: DC
  Administered 2015-10-08 – 2015-10-10 (×2): 2 via ORAL
  Filled 2015-10-08 (×2): qty 2

## 2015-10-08 MED ORDER — LACTATED RINGERS IV SOLN: 500.0000 mL | INTRAVENOUS | Status: DC | PRN

## 2015-10-08 MED ORDER — ACETAMINOPHEN 325 MG PO TABS: 650.0000 mg | ORAL_TABLET | ORAL | Status: DC | PRN

## 2015-10-08 MED ORDER — DIPHENHYDRAMINE HCL 25 MG PO CAPS: 25.0000 mg | ORAL_CAPSULE | Freq: Four times a day (QID) | ORAL | Status: DC | PRN

## 2015-10-08 MED ORDER — ACETAMINOPHEN 325 MG PO TABS
650.0000 mg | ORAL_TABLET | ORAL | Status: DC | PRN
  Administered 2015-10-09: 650 mg via ORAL
  Filled 2015-10-08: qty 2

## 2015-10-08 MED ORDER — FLEET ENEMA 7-19 GM/118ML RE ENEM: 1.0000 | ENEMA | RECTAL | Status: DC | PRN

## 2015-10-08 MED ORDER — WITCH HAZEL-GLYCERIN EX PADS: 1.0000 "application " | MEDICATED_PAD | CUTANEOUS | Status: DC | PRN

## 2015-10-08 MED ORDER — IBUPROFEN 600 MG PO TABS
600.0000 mg | ORAL_TABLET | Freq: Four times a day (QID) | ORAL | Status: DC
  Administered 2015-10-08 – 2015-10-10 (×7): 600 mg via ORAL
  Filled 2015-10-08 (×7): qty 1

## 2015-10-08 MED ORDER — OXYCODONE-ACETAMINOPHEN 5-325 MG PO TABS: 2.0000 | ORAL_TABLET | ORAL | Status: DC | PRN

## 2015-10-08 MED ORDER — DIPHENHYDRAMINE HCL 50 MG/ML IJ SOLN: 12.5000 mg | INTRAMUSCULAR | Status: DC | PRN

## 2015-10-08 MED ORDER — ZOLPIDEM TARTRATE 5 MG PO TABS: 5.0000 mg | ORAL_TABLET | Freq: Every evening | ORAL | Status: DC | PRN

## 2015-10-08 MED ORDER — DIBUCAINE 1 % RE OINT: 1.0000 "application " | TOPICAL_OINTMENT | RECTAL | Status: DC | PRN

## 2015-10-08 MED ORDER — OXYTOCIN 40 UNITS IN LACTATED RINGERS INFUSION - SIMPLE MED: 2.5000 [IU]/h | INTRAVENOUS | Status: DC

## 2015-10-08 MED ORDER — PHENYLEPHRINE 40 MCG/ML (10ML) SYRINGE FOR IV PUSH (FOR BLOOD PRESSURE SUPPORT)
80.0000 ug | PREFILLED_SYRINGE | INTRAVENOUS | Status: DC | PRN
  Filled 2015-10-08: qty 5

## 2015-10-08 MED ORDER — LACTATED RINGERS IV SOLN
INTRAVENOUS | Status: DC
  Administered 2015-10-08 (×3): via INTRAVENOUS

## 2015-10-08 MED ORDER — OXYTOCIN BOLUS FROM INFUSION: 500.0000 mL | INTRAVENOUS | Status: DC

## 2015-10-08 MED ORDER — OXYCODONE HCL 5 MG PO TABS
5.0000 mg | ORAL_TABLET | ORAL | Status: DC | PRN
  Administered 2015-10-09: 5 mg via ORAL
  Filled 2015-10-08: qty 1

## 2015-10-08 MED ORDER — ONDANSETRON HCL 4 MG/2ML IJ SOLN: 4.0000 mg | INTRAMUSCULAR | Status: DC | PRN

## 2015-10-08 MED ORDER — EPHEDRINE 5 MG/ML INJ
10.0000 mg | INTRAVENOUS | Status: DC | PRN
  Filled 2015-10-08: qty 2

## 2015-10-08 MED ORDER — OXYTOCIN 40 UNITS IN LACTATED RINGERS INFUSION - SIMPLE MED
1.0000 m[IU]/min | INTRAVENOUS | Status: DC
  Administered 2015-10-08: 2 m[IU]/min via INTRAVENOUS
  Filled 2015-10-08: qty 1000

## 2015-10-08 MED ORDER — LIDOCAINE HCL (PF) 1 % IJ SOLN
INTRAMUSCULAR | Status: DC | PRN
  Administered 2015-10-08 (×2): 4 mL via EPIDURAL

## 2015-10-08 MED ORDER — TETANUS-DIPHTH-ACELL PERTUSSIS 5-2.5-18.5 LF-MCG/0.5 IM SUSP: 0.5000 mL | Freq: Once | INTRAMUSCULAR | Status: DC

## 2015-10-08 MED ORDER — TERBUTALINE SULFATE 1 MG/ML IJ SOLN
0.2500 mg | Freq: Once | INTRAMUSCULAR | Status: DC | PRN
  Filled 2015-10-08: qty 1

## 2015-10-08 MED ORDER — FENTANYL 2.5 MCG/ML BUPIVACAINE 1/10 % EPIDURAL INFUSION (WH - ANES)
INTRAMUSCULAR | Status: AC
  Filled 2015-10-08: qty 125

## 2015-10-08 MED ORDER — PHENYLEPHRINE 40 MCG/ML (10ML) SYRINGE FOR IV PUSH (FOR BLOOD PRESSURE SUPPORT)
PREFILLED_SYRINGE | INTRAVENOUS | Status: AC
  Filled 2015-10-08: qty 20

## 2015-10-08 MED ORDER — OXYCODONE-ACETAMINOPHEN 5-325 MG PO TABS: 1.0000 | ORAL_TABLET | ORAL | Status: DC | PRN

## 2015-10-08 MED ORDER — BENZOCAINE-MENTHOL 20-0.5 % EX AERO
1.0000 "application " | INHALATION_SPRAY | CUTANEOUS | Status: DC | PRN
  Administered 2015-10-09: 1 via TOPICAL
  Filled 2015-10-08: qty 56

## 2015-10-08 MED ORDER — COCONUT OIL OIL
1.0000 "application " | TOPICAL_OIL | Status: DC | PRN
  Administered 2015-10-09: 1 via TOPICAL
  Filled 2015-10-08: qty 120

## 2015-10-08 MED ORDER — PRENATAL MULTIVITAMIN CH
1.0000 | ORAL_TABLET | Freq: Every day | ORAL | Status: DC
  Administered 2015-10-09 – 2015-10-10 (×2): 1 via ORAL
  Filled 2015-10-08 (×2): qty 1

## 2015-10-08 MED ORDER — OXYCODONE HCL 5 MG PO TABS: 10.0000 mg | ORAL_TABLET | ORAL | Status: DC | PRN

## 2015-10-08 MED ORDER — LIDOCAINE HCL (PF) 1 % IJ SOLN
30.0000 mL | INTRAMUSCULAR | Status: DC | PRN
  Filled 2015-10-08: qty 30

## 2015-10-08 MED ORDER — SOD CITRATE-CITRIC ACID 500-334 MG/5ML PO SOLN: 30.0000 mL | ORAL | Status: DC | PRN

## 2015-10-08 MED ORDER — FENTANYL 2.5 MCG/ML BUPIVACAINE 1/10 % EPIDURAL INFUSION (WH - ANES)
14.0000 mL/h | INTRAMUSCULAR | Status: DC | PRN
  Administered 2015-10-08 (×2): 14 mL/h via EPIDURAL
  Filled 2015-10-08: qty 125

## 2015-10-08 NOTE — MAU Note (Addendum)
PT  SAYS  AT 2315-    SHE WAS ASLEEP-  WOKE - HAD NOT FELT BABY MOVE- WENT   BACK TO SLEEP-   THEN HAD FLUID RUN DOWN  HER LEGS-   AND  IT  CONTINUES  TO  COME  OUT.       VE IN OFFICE - LAST WEEK -  CLOSED-   VERTEX.       GBS-  UNSURE.       DENIES HSV AND MRSA.Marland Kitchen.   LAST SEX-    SUN AM.      SAT/ SUN   FELT  SOME UC'S.

## 2015-10-08 NOTE — Progress Notes (Signed)
Delivery Note At 6:48 PM a viable female was delivered via Vaginal, Spontaneous Delivery (Presentation: Left Occiput Anterior).  APGAR: 9, 9; weight  .   Placenta status: Intact, Spontaneous.  Cord: 3 vessels with the following complications: None.  Cord pH: pending  Anesthesia: Epidural  Episiotomy:  none Lacerations:  2 anterior and a 7:00 second degree lacs repaired Suture Repair: 2.0 vicryl rapide Est. Blood Loss (mL):  250  Mom to postpartum.  Baby to Couplet care / Skin to Skin.  Envi Eagleson II,Jayel Inks E 10/08/2015, 7:02 PM

## 2015-10-08 NOTE — Anesthesia Preprocedure Evaluation (Signed)
Anesthesia Evaluation  Patient identified by MRN, date of birth, ID band Patient awake    Reviewed: Allergy & Precautions, H&P , Patient's Chart, lab work & pertinent test results  Airway Mallampati: III  TM Distance: >3 FB Neck ROM: full    Dental no notable dental hx. (+) Teeth Intact   Pulmonary neg pulmonary ROS,    Pulmonary exam normal breath sounds clear to auscultation       Cardiovascular negative cardio ROS Normal cardiovascular exam Rhythm:regular Rate:Normal     Neuro/Psych  Headaches, negative psych ROS   GI/Hepatic negative GI ROS, Neg liver ROS,   Endo/Other  negative endocrine ROS  Renal/GU negative Renal ROS  negative genitourinary   Musculoskeletal Crouzon's syndrome   Abdominal   Peds  Hematology  (+) anemia ,   Anesthesia Other Findings   Reproductive/Obstetrics (+) Pregnancy                             Anesthesia Physical Anesthesia Plan  ASA: II  Anesthesia Plan: Epidural   Post-op Pain Management:    Induction:   Airway Management Planned:   Additional Equipment:   Intra-op Plan:   Post-operative Plan:   Informed Consent: I have reviewed the patients History and Physical, chart, labs and discussed the procedure including the risks, benefits and alternatives for the proposed anesthesia with the patient or authorized representative who has indicated his/her understanding and acceptance.     Plan Discussed with: Anesthesiologist  Anesthesia Plan Comments:         Anesthesia Quick Evaluation

## 2015-10-08 NOTE — Anesthesia Pain Management Evaluation Note (Signed)
  CRNA Pain Management Visit Note  Patient: Latoya Martin, 25 y.o., female  "Hello I am a member of the anesthesia team at [facility]. We have an anesthesia team available at all times to provide care throughout the hospital, including epidural management and anesthesia for C-section. I don't know your plan for the delivery whether it a natural birth, water birth, IV sedation, nitrous supplementation, doula or epidural, but we want to meet your pain goals."   1.Was your pain managed to your expectations on prior hospitalizations?   No prior hospitalizations  2.What is your expectation for pain management during this hospitalization?     Epidural  3.How can we help you reach that goal? epidural  Record the patient's initial score and the patient's pain goal.   Pain: 8  Pain Goal: 8 [facility] wants you to be able to say your pain was always managed very well.  Monterius Rolf 10/08/2015

## 2015-10-08 NOTE — Anesthesia Procedure Notes (Signed)
Epidural Patient location during procedure: OB Start time: 10/08/2015 11:25 AM  Staffing Anesthesiologist: Mal AmabileFOSTER, Geovani Tootle Performed by: anesthesiologist   Preanesthetic Checklist Completed: patient identified, site marked, surgical consent, pre-op evaluation, timeout performed, IV checked, risks and benefits discussed and monitors and equipment checked  Epidural Patient position: sitting Prep: site prepped and draped and DuraPrep Patient monitoring: continuous pulse ox and blood pressure Approach: midline Location: L3-L4 Injection technique: LOR air  Needle:  Needle type: Tuohy  Needle gauge: 17 G Needle length: 9 cm and 9 Needle insertion depth: 5 cm cm Catheter type: closed end flexible Catheter size: 19 Gauge Catheter at skin depth: 10 cm Test dose: negative and Other  Assessment Events: blood not aspirated, injection not painful, no injection resistance, negative IV test and no paresthesia  Additional Notes Patient identified. Risks and benefits discussed including failed block, incomplete  Pain control, post dural puncture headache, nerve damage, paralysis, blood pressure Changes, nausea, vomiting, reactions to medications-both toxic and allergic and post Partum back pain. All questions were answered. Patient expressed understanding and wished to proceed. Sterile technique was used throughout procedure. Epidural site was Dressed with sterile barrier dressing. No paresthesias, signs of intravascular injection Or signs of intrathecal spread were encountered.  Patient was more comfortable after the epidural was dosed. Please see RN's note for documentation of vital signs and FHR which are stable.

## 2015-10-08 NOTE — H&P (Signed)
Latoya Martin is a 25 y.o. female presenting for SROM , clear, about 11pm. Now some UCs.U/S 1 week ago about 7#. Maternal Medical History:  Reason for admission: Rupture of membranes.   Contractions: Onset was 3-5 hours ago.   Frequency: irregular.   Perceived severity is mild.    Fetal activity: Perceived fetal activity is normal.      OB History    Gravida Para Term Preterm AB TAB SAB Ectopic Multiple Living   1              Past Medical History  Diagnosis Date  . Crouzon syndrome   . Headache   . Vaginal Pap smear, abnormal    Past Surgical History  Procedure Laterality Date  . Head surgery       crouzon syndrome  . Wisdom tooth extraction     Family History: family history is not on file. Social History:  reports that she has never smoked. She does not have any smokeless tobacco history on file. She reports that she drinks alcohol. She reports that she does not use illicit drugs.   Prenatal Transfer Tool  Maternal Diabetes: No Genetic Screening: Normal Maternal Ultrasounds/Referrals: Normal Fetal Ultrasounds or other Referrals:  None Maternal Substance Abuse:  No Significant Maternal Medications:  None Significant Maternal Lab Results:  None Other Comments:  None  Review of Systems  HENT:       Mild HA earlier, now gone after juice  Eyes: Negative for blurred vision.  Gastrointestinal: Negative for abdominal pain.  Neurological: Negative for headaches.    Dilation: 2.5 Effacement (%): 60 Station: -3 Exam by:: L. Cresenzo, rNC Blood pressure 113/71, pulse 91, temperature 97.9 F (36.6 C), temperature source Oral, resp. rate 18, height 5\' 6"  (1.676 m), weight 180 lb 4 oz (81.761 kg), last menstrual period 10/05/2014, SpO2 100 %. Maternal Exam:  Uterine Assessment: Contraction strength is mild.  Contraction frequency is irregular.   Abdomen: Patient reports no abdominal tenderness.   Fetal Exam Fetal State Assessment: Category I - tracings are  normal.     Physical Exam  Cardiovascular: Normal rate and regular rhythm.   Respiratory: Effort normal and breath sounds normal.  GI: Soft. There is no tenderness.  Neurological: She has normal reflexes.    Prenatal labs: ABO, Rh: --/--/A POS, A POS (07/03 0150) Antibody: NEG (07/03 0150) Rubella: Nonimmune (12/28 0000) RPR: Nonreactive (12/28 0000)  HBsAg: Negative (12/28 0000)  HIV: Non-reactive (12/28 0000)  GBS: Negative (12/28 0000)   Assessment/Plan: 25 yo G1P0 @ 36 3/7 weeks with PROM Pitocin now started D/W patient above   Latoya Martin,Latoya Martin 10/08/2015, 8:07 AM

## 2015-10-08 NOTE — Progress Notes (Signed)
Cx 4/80/-2 IUPC placed FHT cat one Epidural in

## 2015-10-09 LAB — CBC
HCT: 26.8 % — ABNORMAL LOW (ref 36.0–46.0)
Hemoglobin: 9.2 g/dL — ABNORMAL LOW (ref 12.0–15.0)
MCH: 29.7 pg (ref 26.0–34.0)
MCHC: 34.3 g/dL (ref 30.0–36.0)
MCV: 86.5 fL (ref 78.0–100.0)
Platelets: 134 10*3/uL — ABNORMAL LOW (ref 150–400)
RBC: 3.1 MIL/uL — AB (ref 3.87–5.11)
RDW: 14.8 % (ref 11.5–15.5)
WBC: 11.9 10*3/uL — ABNORMAL HIGH (ref 4.0–10.5)

## 2015-10-09 NOTE — Progress Notes (Signed)
Patient doing well. Breastfeeding well. BP 112/67 mmHg  Pulse 70  Temp(Src) 97.9 F (36.6 C) (Oral)  Resp 18  Ht 5\' 6"  (1.676 m)  Wt 81.761 kg (180 lb 4 oz)  BMI 29.11 kg/m2  SpO2 100%  LMP 10/05/2014  Breastfeeding? Unknown Results for orders placed or performed during the hospital encounter of 10/07/15 (from the past 24 hour(s))  CBC     Status: Abnormal   Collection Time: 10/09/15  5:11 AM  Result Value Ref Range   WBC 11.9 (H) 4.0 - 10.5 K/uL   RBC 3.10 (L) 3.87 - 5.11 MIL/uL   Hemoglobin 9.2 (L) 12.0 - 15.0 g/dL   HCT 40.126.8 (L) 02.736.0 - 25.346.0 %   MCV 86.5 78.0 - 100.0 fL   MCH 29.7 26.0 - 34.0 pg   MCHC 34.3 30.0 - 36.0 g/dL   RDW 66.414.8 40.311.5 - 47.415.5 %   Platelets 134 (L) 150 - 400 K/uL   Abdomen is soft and non tender  IMPRESSION: PPD #1 Doing well Desires circ - patient consented PLAN: Routine care Discharge tomorrow

## 2015-10-09 NOTE — Anesthesia Postprocedure Evaluation (Signed)
Anesthesia Post Note  Patient: Physiological scientistWhitney Hollywood  Procedure(s) Performed: * No procedures listed *  Patient location during evaluation: Mother Baby Anesthesia Type: Epidural Level of consciousness: oriented and awake and alert Pain management: pain level controlled Vital Signs Assessment: post-procedure vital signs reviewed and stable Respiratory status: spontaneous breathing and nonlabored ventilation Cardiovascular status: stable Postop Assessment: epidural receding, patient able to bend at knees, no signs of nausea or vomiting and adequate PO intake Anesthetic complications: no     Last Vitals:  Filed Vitals:   10/08/15 2100 10/09/15 0140  BP: 116/71 112/67  Pulse: 88 70  Temp: 36.6 C 36.6 C  Resp: 18 18    Last Pain:  Filed Vitals:   10/09/15 0634  PainSc: 2    Pain Goal:                 Laban EmperorMalinova,Alexianna Nachreiner Hristova

## 2015-10-09 NOTE — Lactation Note (Signed)
This note was copied from a baby's chart. Lactation Consultation Note:Lactation brochure given. Infant was circumcised this am. Mother states she has attempt to rouse infant and feed but infant is too sleepy. Mother is eating breakfast . Mother advised to page for Lactation assistance when she finishes meal . Mother needs basic teaching.   Patient Name: Latoya Lindley MagnusWhitney Martin ZOXWR'UToday's Date: 10/09/2015 Reason for consult: Initial assessment   Maternal Data Has patient been taught Hand Expression?: Yes Does the patient have breastfeeding experience prior to this delivery?: No  Feeding    LATCH Score/Interventions                      Lactation Tools Discussed/Used     Consult Status Consult Status: Follow-up Date: 10/09/15 Follow-up type: In-patient    Stevan BornKendrick, Rocky Gladden [facility] 10/09/2015, 10:20 AM

## 2015-10-09 NOTE — Lactation Note (Signed)
This note was copied from a baby's chart. Lactation Consultation Note: Mother paged to check latch. Mother is in layed back breastfeeding  position using cradle hold. Infant latched only on the tip of the nipple. Basic teaching done using Baby and Me booklet. Assist with adjusting infants chin for a wider gape. Mother has bilateral suck blisters on her nipples . Mother states latch feels much better. Discussed cluster feeding and cue base feeding. Advise mother to feed infant 8-12 times in 24 hours. Mother taught hand expression.  Mother was given LPI guidelines with supplementation perimeters . Discussed spoon feeding after each feeding. Infant sustained latch with good burst of suckling and swallows for 35 mins.   Mother receptive to all teaching.   Patient Name: Latoya Martin ZOXWR'UToday's Date: 10/09/2015 Reason for consult: Initial assessment   Maternal Data Has patient been taught Hand Expression?: Yes Does the patient have breastfeeding experience prior to this delivery?: No  Feeding Feeding Type: Breast Fed Length of feed: 35 min  LATCH Score/Interventions Latch: Grasps breast easily, tongue down, lips flanged, rhythmical sucking. Intervention(s): Adjust position;Assist with latch;Breast compression  Audible Swallowing: Spontaneous and intermittent Intervention(s): Skin to skin;Hand expression Intervention(s): Skin to skin;Hand expression  Type of Nipple: Everted at rest and after stimulation  Comfort (Breast/Nipple): Soft / non-tender     Hold (Positioning): Assistance needed to correctly position infant at breast and maintain latch. Intervention(s): Breastfeeding basics reviewed;Support Pillows;Position options;Skin to skin  LATCH Score: 9  Lactation Tools Discussed/Used     Consult Status Consult Status: Follow-up Date: 10/09/15 Follow-up type: In-patient    Stevan BornKendrick, Macguire Holsinger [facility] 10/09/2015, 12:02 PM

## 2015-10-10 MED ORDER — IBUPROFEN 600 MG PO TABS: 600.0000 mg | ORAL_TABLET | Freq: Four times a day (QID) | ORAL | Status: DC

## 2015-10-10 NOTE — Discharge Summary (Signed)
Obstetric Discharge Summary Reason for Admission: rupture of membranes Prenatal Procedures: ultrasound Intrapartum Procedures: spontaneous vaginal delivery Postpartum Procedures: none Complications-Operative and Postpartum: 2 degree perineal laceration HEMOGLOBIN  Date Value Ref Range Status  10/09/2015 9.2* 12.0 - 15.0 g/dL Final   HCT  Date Value Ref Range Status  10/09/2015 26.8* 36.0 - 46.0 % Final    Physical Exam:  General: alert and cooperative Lochia: appropriate Uterine Fundus: firm Incision: healing well DVT Evaluation: No evidence of DVT seen on physical exam. Negative Homan's sign. No cords or calf tenderness. No significant calf/ankle edema.  Discharge Diagnoses: Term Pregnancy-delivered  Discharge Information: Date: 10/10/2015 Activity: pelvic rest Diet: routine Medications: PNV and Ibuprofen Condition: stable Instructions: refer to practice specific booklet Discharge to: home   Newborn Data: Live born female  Birth Weight: 7 lb 0.3 oz (3184 g) APGAR: 9, 9  Home with mother.  CURTIS,CAROL G 10/10/2015, 8:00 AM

## 2015-10-10 NOTE — Lactation Note (Signed)
This note was copied from a baby's chart. Lactation Consultation Note: Mother just finished a 10 min feeding. Infant now sleeping. I didn't observed the feeding. Mother states infant fed well. Advised mother the best plan to offer infant supplement of ebm. Due to infants risk of jaundice and Late Preterm infant. Reviewed supplemental guidelines again. Mother informed that infant my loose less tomorrow if supplement today. Mother states she will try spoon feeding today. Plan to repeat Bilirubin this pm. Mother declines need for assistance with supplementing or feedings. Suggest frequent feedings STS for better feedings and to rouse infant. Mother receptive to teaching. Mother was given a harmony hand pump with instructions. Highlighted information on treatment to prevent severe engorgement. Mother is aware of available LC services. Declines follow up with LC. Mother states she will call if needed.   Patient Name: Latoya Martin ZOXWR'UToday's Date: 10/10/2015 Reason for consult: Follow-up assessment   Maternal Data    Feeding Feeding Type: Breast Fed Length of feed: 10 min  LATCH Score/Interventions                      Lactation Tools Discussed/Used     Consult Status Consult Status: Complete    Michel BickersKendrick, Regie Bunner [facility] 10/10/2015, 10:21 AM

## 2015-10-11 ENCOUNTER — Ambulatory Visit: Payer: Self-pay

## 2015-10-11 NOTE — Lactation Note (Signed)
This note was copied from a baby's chart. Lactation Consultation Note  Patient Name: Latoya Martin ZOXWR'UToday's Date: 10/11/2015  Per RN, Mom reports BF is going well, milk is coming in. Mom will advise if desires LC services before d/c home.    Maternal Data    Feeding Feeding Type: Breast Fed Length of feed: 30 min  LATCH Score/Interventions                      Lactation Tools Discussed/Used     Consult Status      Alfred LevinsGranger, Neil Brickell Ann 10/11/2015, 10:33 AM

## 2015-11-14 DIAGNOSIS — N83299 Other ovarian cyst, unspecified side: Secondary | ICD-10-CM | POA: Diagnosis not present

## 2015-11-14 DIAGNOSIS — D509 Iron deficiency anemia, unspecified: Secondary | ICD-10-CM | POA: Diagnosis not present

## 2015-11-14 DIAGNOSIS — N926 Irregular menstruation, unspecified: Secondary | ICD-10-CM | POA: Diagnosis not present

## 2015-11-20 DIAGNOSIS — Z1389 Encounter for screening for other disorder: Secondary | ICD-10-CM | POA: Diagnosis not present

## 2015-12-04 DIAGNOSIS — J029 Acute pharyngitis, unspecified: Secondary | ICD-10-CM | POA: Diagnosis not present

## 2015-12-18 DIAGNOSIS — H109 Unspecified conjunctivitis: Secondary | ICD-10-CM | POA: Diagnosis not present

## 2016-11-04 DIAGNOSIS — B349 Viral infection, unspecified: Secondary | ICD-10-CM | POA: Diagnosis not present

## 2019-02-25 DIAGNOSIS — Z20828 Contact with and (suspected) exposure to other viral communicable diseases: Secondary | ICD-10-CM | POA: Diagnosis not present

## 2019-12-27 ENCOUNTER — Other Ambulatory Visit: Payer: Self-pay | Admitting: Radiology

## 2019-12-27 DIAGNOSIS — N6315 Unspecified lump in the right breast, overlapping quadrants: Secondary | ICD-10-CM

## 2020-01-09 ENCOUNTER — Ambulatory Visit
Admission: RE | Admit: 2020-01-09 | Discharge: 2020-01-09 | Disposition: A | Discharge status: Home / Self Care | Payer: BLUE CROSS/BLUE SHIELD | Source: Ambulatory Visit | Attending: Radiology | Admitting: Radiology

## 2020-01-09 ENCOUNTER — Ambulatory Visit
Admission: RE | Admit: 2020-01-09 | Discharge: 2020-01-09 | Disposition: A | Discharge status: Home / Self Care | Payer: BC Managed Care – PPO | Source: Ambulatory Visit | Attending: Radiology | Admitting: Radiology

## 2020-01-09 ENCOUNTER — Other Ambulatory Visit: Payer: Self-pay

## 2020-01-09 ENCOUNTER — Other Ambulatory Visit: Payer: Self-pay | Admitting: Radiology

## 2020-01-09 DIAGNOSIS — N6310 Unspecified lump in the right breast, unspecified quadrant: Secondary | ICD-10-CM

## 2020-01-09 DIAGNOSIS — N6315 Unspecified lump in the right breast, overlapping quadrants: Secondary | ICD-10-CM

## 2020-07-10 ENCOUNTER — Inpatient Hospital Stay: Admission: RE | Admit: 2020-07-10 | Payer: BC Managed Care – PPO | Source: Ambulatory Visit

## 2021-04-07 NOTE — L&D Delivery Note (Signed)
Delivery Note At 5:12 PM a viable female was delivered via Vaginal, Spontaneous (Presentation:   Occiput Anterior).  APGAR: 8, 9; weight 9 lb 13.7 oz (4470 g).   Placenta status: Spontaneous, Intact.  Cord: 3 vessels with the following complications: None.  Cord pH: not sent Pushed twice over intact perineum. Baby cried immediately after birth.  Anesthesia: Epidural Episiotomy: None Lacerations:  Clitoral Suture Repair: vicryl 4'0 Est. Blood Loss (mL): 406cc - mostly from clitoral lac   It's a boy - "Kellogg"!!  Mom to postpartum.  Baby to Couplet care / Skin to Skin.  Colin Benton Dusti Tetro 02/03/2022, 7:00 PM

## 2021-07-22 LAB — OB RESULTS CONSOLE HEPATITIS B SURFACE ANTIGEN: Hepatitis B Surface Ag: NEGATIVE

## 2021-07-22 LAB — OB RESULTS CONSOLE RPR: RPR: NONREACTIVE

## 2021-07-22 LAB — OB RESULTS CONSOLE RUBELLA ANTIBODY, IGM: Rubella: NON-IMMUNE/NOT IMMUNE

## 2021-07-22 LAB — HEPATITIS C ANTIBODY: HCV Ab: NEGATIVE

## 2021-07-22 LAB — OB RESULTS CONSOLE ABO/RH: RH Type: POSITIVE

## 2021-07-22 LAB — OB RESULTS CONSOLE ANTIBODY SCREEN: Antibody Screen: NEGATIVE

## 2021-07-22 LAB — OB RESULTS CONSOLE HIV ANTIBODY (ROUTINE TESTING): HIV: NONREACTIVE

## 2021-07-24 LAB — OB RESULTS CONSOLE GC/CHLAMYDIA
Chlamydia: NEGATIVE
Neisseria Gonorrhea: NEGATIVE

## 2021-08-20 ENCOUNTER — Inpatient Hospital Stay (HOSPITAL_COMMUNITY)
Admission: AD | Admit: 2021-08-20 | Discharge: 2021-08-20 | Disposition: A | Discharge status: Home / Self Care | Payer: BC Managed Care – PPO | Attending: Obstetrics & Gynecology | Admitting: Obstetrics & Gynecology

## 2021-08-20 ENCOUNTER — Encounter (HOSPITAL_COMMUNITY): Payer: Self-pay

## 2021-08-20 DIAGNOSIS — Z20822 Contact with and (suspected) exposure to covid-19: Secondary | ICD-10-CM | POA: Diagnosis not present

## 2021-08-20 DIAGNOSIS — Z3A15 15 weeks gestation of pregnancy: Secondary | ICD-10-CM | POA: Diagnosis not present

## 2021-08-20 DIAGNOSIS — R519 Headache, unspecified: Secondary | ICD-10-CM | POA: Diagnosis not present

## 2021-08-20 DIAGNOSIS — O26892 Other specified pregnancy related conditions, second trimester: Secondary | ICD-10-CM | POA: Diagnosis not present

## 2021-08-20 LAB — RESP PANEL BY RT-PCR (FLU A&B, COVID) ARPGX2
Influenza A by PCR: NEGATIVE
Influenza B by PCR: NEGATIVE
SARS Coronavirus 2 by RT PCR: NEGATIVE

## 2021-08-20 LAB — URINALYSIS, ROUTINE W REFLEX MICROSCOPIC
Bilirubin Urine: NEGATIVE
Glucose, UA: NEGATIVE mg/dL
Hgb urine dipstick: NEGATIVE
Ketones, ur: NEGATIVE mg/dL
Nitrite: NEGATIVE
Protein, ur: NEGATIVE mg/dL
Specific Gravity, Urine: 1.023 (ref 1.005–1.030)
pH: 7 (ref 5.0–8.0)

## 2021-08-20 MED ORDER — MAGNESIUM SULFATE 2 GM/50ML IV SOLN
2.0000 g | Freq: Once | INTRAVENOUS | Status: AC
  Administered 2021-08-20: 2 g via INTRAVENOUS
  Filled 2021-08-20: qty 50

## 2021-08-20 MED ORDER — METOCLOPRAMIDE HCL 5 MG/ML IJ SOLN
10.0000 mg | Freq: Once | INTRAMUSCULAR | Status: AC
  Administered 2021-08-20: 10 mg via INTRAVENOUS
  Filled 2021-08-20: qty 2

## 2021-08-20 MED ORDER — LACTATED RINGERS IV BOLUS
1000.0000 mL | Freq: Once | INTRAVENOUS | Status: AC
  Administered 2021-08-20: 1000 mL via INTRAVENOUS

## 2021-08-20 MED ORDER — DIPHENHYDRAMINE HCL 50 MG/ML IJ SOLN
25.0000 mg | Freq: Once | INTRAMUSCULAR | Status: AC
  Administered 2021-08-20: 25 mg via INTRAVENOUS
  Filled 2021-08-20: qty 1

## 2021-08-20 MED ORDER — BUTALBITAL-APAP-CAFFEINE 50-325-40 MG PO TABS
2.0000 | ORAL_TABLET | Freq: Once | ORAL | Status: AC
  Administered 2021-08-20: 2 via ORAL
  Filled 2021-08-20: qty 2

## 2021-08-20 NOTE — MAU Note (Signed)
.  Latoya Martin is a 31 y.o. at [redacted]w[redacted]d here in MAU reporting: HA that started on Sunday that has not been responding to at home treatment (Tylenol). Pain 7/10 currently. Has reported blurry vision, seeing spots, and lightheaded when walking outside or changing positions quickly. Denies VB or LOF.  ? ?Onset of complaint: Monday ?Pain score: 7/10 ?Vitals:  ? 08/20/21 1115  ?BP: 107/61  ?Pulse: 100  ?Resp: 18  ?Temp: 98.2 ?F (36.8 ?C)  ?SpO2: 99%  ?   ?FHT:152 ?Lab orders placed from triage:  UA ? ?

## 2021-08-20 NOTE — MAU Provider Note (Signed)
?History  ?  ? ?CSN: [id number] ? ?Arrival date and time: 08/20/21 1100 ? ? Event Date/Time  ? First Provider Initiated Contact with Patient 08/20/21 1129   ?  ? ?No chief complaint on file. ? ?Call from Dr. Lynnette Caffey pt being sent over from the office. Report Received by S. Rollene Rotunda CNM.  ? ?Upon arrival, Latoya Martin 31 y.o. [id number] [redacted]w[redacted]d reports headache since Sunday rating 9/10. She has attempted tylenol, sleeping, eating,  drinking, resting, warm wash cloths, with no relief. Headache worsened by light and sound. Closing her eyes "helps" but only for a "short time." She endorses dizziness, blurred vision and fatigue. She also endorses a fever yesterday of 100.5F and some pelvic discomfort that were both relieved by oral Tylenol. Pt states she's also experiencing some SOB with movement. Pt states she also took tylenol this AM and temp in office was 98.70F. She denies VB, LOF, epigastric pain. She endorses seasonal allergies but denies any possible flu/COVID exposure.   ? ? ?OB History   ? ? Gravida  ?2  ? Para  ?1  ? Term  ?   ? Preterm  ?1  ? AB  ?   ? Living  ?1  ?  ? ? SAB  ?   ? IAB  ?   ? Ectopic  ?   ? Multiple  ?0  ? Live Births  ?1  ?   ?  ?  ? ? ?Past Medical History:  ?Diagnosis Date  ? Crouzon syndrome   ? Headache   ? Vaginal Pap smear, abnormal   ? ? ?Past Surgical History:  ?Procedure Laterality Date  ? head surgery     ? crouzon syndrome  ? NO PAST SURGERIES    ? WISDOM TOOTH EXTRACTION    ? ? ?History reviewed. No pertinent family history. ? ?Social History  ? ?Tobacco Use  ? Smoking status: Never  ?Substance Use Topics  ? Alcohol use: Yes  ?  Comment: not while preg  ? Drug use: No  ? ? ?Allergies: No Known Allergies ? ?Medications Prior to Admission  ?Medication Sig Dispense Refill Last Dose  ? acetaminophen (TYLENOL) 500 MG tablet Take 1,000 mg by mouth every 6 (six) hours as needed.   08/20/2021 at [place]  ? Prenatal Vit-Fe Fumarate-FA (MULTIVITAMIN-PRENATAL) 27-0.8 MG TABS tablet Take 1 tablet by  mouth daily at 12 noon.   08/20/2021  ? ibuprofen (ADVIL,MOTRIN) 600 MG tablet Take 1 tablet (600 mg total) by mouth every 6 (six) hours. 30 tablet 1   ? ? ?Review of Systems  ?Constitutional:  Positive for appetite change, fatigue and fever. Negative for chills and diaphoresis.  ?HENT:  Negative for congestion, rhinorrhea, sneezing and sore throat.   ?Eyes:  Positive for photophobia and visual disturbance.  ?Respiratory:  Positive for shortness of breath. Negative for apnea, chest tightness and wheezing.   ?Cardiovascular:  Negative for chest pain.  ?Gastrointestinal:  Negative for nausea and vomiting.  ?     Pregnant  ?Genitourinary:  Negative for vaginal bleeding and vaginal discharge.  ?Skin:  Negative for rash.  ?Allergic/Immunologic: Positive for environmental allergies.  ?Neurological:  Positive for dizziness, light-headedness and headaches. Negative for seizures, weakness and numbness.  ?Psychiatric/Behavioral:  Negative for agitation, hallucinations and suicidal ideas. The patient is not nervous/anxious.   ?Physical Exam  ? ?Blood pressure 107/61, pulse 100, temperature 98.2 ?F (36.8 ?C), temperature source Oral, resp. rate 18, height 5\' 7"  (1.702 m), weight 62.1 kg, SpO2  99 %, unknown if currently breastfeeding. ? ?Physical Exam ?Vitals and nursing note reviewed.  ?Constitutional:   ?   General: She is not in acute distress. ?   Appearance: Normal appearance.  ?HENT:  ?   Head: Normocephalic.  ?   Nose: No congestion or rhinorrhea.  ?Eyes:  ?   Conjunctiva/sclera: Conjunctivae normal.  ?Cardiovascular:  ?   Rate and Rhythm: Normal rate and regular rhythm.  ?   Pulses: Normal pulses.  ?   Heart sounds: Normal heart sounds.  ?Pulmonary:  ?   Effort: Pulmonary effort is normal. No respiratory distress.  ?   Breath sounds: Normal breath sounds.  ?Abdominal:  ?   Palpations: Abdomen is soft.  ?   Comments: Pregnant   ?Musculoskeletal:     ?   General: Normal range of motion.  ?   Cervical back: Normal range of  motion.  ?Skin: ?   General: Skin is warm and dry.  ?Neurological:  ?   Mental Status: She is alert and oriented to person, place, and time.  ?Psychiatric:     ?   Mood and Affect: Mood normal.  ? ? ?MAU Course  ?Procedures ?Orders Placed This Encounter  ?Procedures  ? Resp Panel by RT-PCR (Flu A&B, Covid) Nasopharyngeal Swab  ?  Standing Status:   Standing  ?  Number of Occurrences:   1  ? Urinalysis, Routine w reflex microscopic Urine, Clean Catch  ?  Standing Status:   Standing  ?  Number of Occurrences:   1  ? Airborne and Contact precautions  ?  Standing Status:   Standing  ?  Number of Occurrences:   1  ? ?Results for orders placed or performed during the hospital encounter of 08/20/21 (from the past 24 hour(s))  ?Urinalysis, Routine w reflex microscopic Urine, Clean Catch     Status: Abnormal  ? Collection Time: 08/20/21 11:37 AM  ?Result Value Ref Range  ? Color, Urine AMBER (A) YELLOW  ? APPearance HAZY (A) CLEAR  ? Specific Gravity, Urine 1.023 1.005 - 1.030  ? pH 7.0 5.0 - 8.0  ? Glucose, UA NEGATIVE NEGATIVE mg/dL  ? Hgb urine dipstick NEGATIVE NEGATIVE  ? Bilirubin Urine NEGATIVE NEGATIVE  ? Ketones, ur NEGATIVE NEGATIVE mg/dL  ? Protein, ur NEGATIVE NEGATIVE mg/dL  ? Nitrite NEGATIVE NEGATIVE  ? Leukocytes,Ua LARGE (A) NEGATIVE  ? RBC / HPF 0-5 0 - 5 RBC/hpf  ? WBC, UA 11-20 0 - 5 WBC/hpf  ? Bacteria, UA RARE (A) NONE SEEN  ? Squamous Epithelial / LPF 21-50 0 - 5  ? Mucus PRESENT   ? ?Meds ordered this encounter  ?Medications  ? butalbital-acetaminophen-caffeine (FIORICET) 50-325-40 MG per tablet 2 tablet  ?  ?MDM ?Headache unrelieved with PO Fioricet and PO hydration.  ?Meds ordered this encounter  ?Medications  ? butalbital-acetaminophen-caffeine (FIORICET) 50-325-40 MG per tablet 2 tablet  ? magnesium sulfate IVPB 2 g 50 mL  ? lactated ringers bolus 1,000 mL  ? metoCLOPramide (REGLAN) injection 10 mg  ? diphenhydrAMINE (BENADRYL) injection 25 mg  ? ?Results for orders placed or performed during the  hospital encounter of 08/20/21 (from the past 24 hour(s))  ?Urinalysis, Routine w reflex microscopic Urine, Clean Catch     Status: Abnormal  ? Collection Time: 08/20/21 11:37 AM  ?Result Value Ref Range  ? Color, Urine AMBER (A) YELLOW  ? APPearance HAZY (A) CLEAR  ? Specific Gravity, Urine 1.023 1.005 - 1.030  ? pH  7.0 5.0 - 8.0  ? Glucose, UA NEGATIVE NEGATIVE mg/dL  ? Hgb urine dipstick NEGATIVE NEGATIVE  ? Bilirubin Urine NEGATIVE NEGATIVE  ? Ketones, ur NEGATIVE NEGATIVE mg/dL  ? Protein, ur NEGATIVE NEGATIVE mg/dL  ? Nitrite NEGATIVE NEGATIVE  ? Leukocytes,Ua LARGE (A) NEGATIVE  ? RBC / HPF 0-5 0 - 5 RBC/hpf  ? WBC, UA 11-20 0 - 5 WBC/hpf  ? Bacteria, UA RARE (A) NONE SEEN  ? Squamous Epithelial / LPF 21-50 0 - 5  ? Mucus PRESENT   ?Resp Panel by RT-PCR (Flu A&B, Covid) Nasopharyngeal Swab     Status: None  ? Collection Time: 08/20/21 12:07 PM  ? Specimen: Nasopharyngeal Swab; Nasopharyngeal(NP) swabs in vial transport medium  ?Result Value Ref Range  ? SARS Coronavirus 2 by RT PCR NEGATIVE NEGATIVE  ? Influenza A by PCR NEGATIVE NEGATIVE  ? Influenza B by PCR NEGATIVE NEGATIVE  ?   ?HA relieved by with above intervention in addition to PO hydration and eating. Pt resting well.  ?Assessment and Plan  ?Headache in Pregnancy  ?[id number] [redacted]w[redacted]d ?- Routine Labor precautions reviewed   ?- Encouraged to continue to PO hydrate and rest as needed. ?- Pt Discharged home in stable condition ?- Pt advised to return to MAU as needed.  ? ?[place]  ?08/20/2021, 11:29 AM  ?

## 2021-09-01 ENCOUNTER — Encounter (HOSPITAL_BASED_OUTPATIENT_CLINIC_OR_DEPARTMENT_OTHER): Payer: Self-pay | Admitting: Emergency Medicine

## 2021-09-01 ENCOUNTER — Emergency Department (HOSPITAL_BASED_OUTPATIENT_CLINIC_OR_DEPARTMENT_OTHER)
Admission: EM | Admit: 2021-09-01 | Discharge: 2021-09-01 | Disposition: A | Discharge status: Home / Self Care | Payer: BC Managed Care – PPO | Attending: Emergency Medicine | Admitting: Emergency Medicine

## 2021-09-01 ENCOUNTER — Emergency Department (HOSPITAL_BASED_OUTPATIENT_CLINIC_OR_DEPARTMENT_OTHER): Payer: BC Managed Care – PPO

## 2021-09-01 ENCOUNTER — Other Ambulatory Visit: Payer: Self-pay

## 2021-09-01 DIAGNOSIS — O26892 Other specified pregnancy related conditions, second trimester: Secondary | ICD-10-CM | POA: Diagnosis not present

## 2021-09-01 DIAGNOSIS — I8392 Asymptomatic varicose veins of left lower extremity: Secondary | ICD-10-CM | POA: Diagnosis not present

## 2021-09-01 DIAGNOSIS — Z3A16 16 weeks gestation of pregnancy: Secondary | ICD-10-CM | POA: Diagnosis not present

## 2021-09-01 DIAGNOSIS — M79605 Pain in left leg: Secondary | ICD-10-CM

## 2021-09-01 DIAGNOSIS — O2200 Varicose veins of lower extremity in pregnancy, unspecified trimester: Secondary | ICD-10-CM

## 2021-09-01 NOTE — ED Notes (Signed)
Ultrasound aware of new order

## 2021-09-01 NOTE — Discharge Instructions (Signed)
You were seen in the emergency department today for left leg pain.  Your ultrasound showed no evidence of a clot.  It did show some dilating of your veins, consistent with varicose veins.  These can sometimes get more irritated, called thrombophlebitis.  In both cases I recommend over-the-counter anti-inflammatories such as Tylenol, as well as compression socks/stockings.  You should wear these during the day, while you are on your feet, but they are not needed at night.  If you continue to have symptoms in about a week or so, I recommend following up either with your OB/GYN or your primary doctor for another ultrasound of your leg.

## 2021-09-01 NOTE — ED Notes (Signed)
Unable to update vitals, pt in ultrasound at this time

## 2021-09-01 NOTE — ED Triage Notes (Signed)
Pt arrives pov with driver, steady gait, c/o LLE pain and decreased sensation. Visible raised blood vessel to medial LE, tenderness noted. Pt reports being g2P1, [redacted] weeks pregnant. Denies pregnancy issues

## 2021-09-01 NOTE — ED Provider Notes (Signed)
[facility] EMERGENCY DEPARTMENT Provider Note   CSN: [id number] Arrival date & time: 09/01/21  1239     History  Chief Complaint  Patient presents with   Leg Pain    Latoya Martin is a 31 y.o. female who presents to the emergency department complaining of left lower leg pain and swelling.  Patient states that she is approximately [redacted] weeks pregnant, G2P1.  Earlier today she noted sudden onset of fairly severe left calf pain, with visibly raised blood vessel on the medial lower left leg.  This was initially tender, and the swelling only went down when she was able to lay flat.  She notes decreased sensation to the left foot, but normal range of motion. No trauma to the area or to the lower back/spine. No wounds. States this has never happened to her before. No history of blood clots.   Pt does not have obvious swelling at this time but showed me an image on her phone.    Leg Pain Associated symptoms: no fever       Home Medications Prior to Admission medications   Medication Sig Start Date End Date Taking? Authorizing Provider  acetaminophen (TYLENOL) 500 MG tablet Take 1,000 mg by mouth every 6 (six) hours as needed.    [provider]  Prenatal Vit-Fe Fumarate-FA (MULTIVITAMIN-PRENATAL) 27-0.8 MG TABS tablet Take 1 tablet by mouth daily at 12 noon.    [provider]      Allergies    Patient has no known allergies.    Review of Systems   Review of Systems  Constitutional:  Negative for fever.  Respiratory:  Negative for cough and shortness of breath.   Cardiovascular:  Negative for chest pain.  Musculoskeletal:        Left leg pain and swelling  Neurological:  Positive for headaches.  All other systems reviewed and are negative.  Physical Exam Updated Vital Signs BP 97/61 (BP Location: Right Arm)   Pulse 77   Temp 98 F (36.7 C) (Oral)   Resp 14   Ht 5\' 7"  (1.702 m)   Wt 62.1 kg   LMP 02/04/2021   SpO2 100%   BMI 21.46 kg/m   Physical Exam Vitals and nursing note reviewed.  Constitutional:      Appearance: Normal appearance.  HENT:     Head: Normocephalic and atraumatic.  Eyes:     Conjunctiva/sclera: Conjunctivae normal.  Cardiovascular:     Rate and Rhythm: Normal rate and regular rhythm.     Pulses:          Dorsalis pedis pulses are 2+ on the right side and 2+ on the left side.       Posterior tibial pulses are 2+ on the right side and 2+ on the left side.  Pulmonary:     Effort: Pulmonary effort is normal. No respiratory distress.     Breath sounds: Normal breath sounds.  Abdominal:     General: There is no distension.     Palpations: Abdomen is soft.     Tenderness: There is no abdominal tenderness.  Musculoskeletal:     Right lower leg: No edema.     Left lower leg: No edema.     Comments: Slight discoloration of the left medial knee along pattern in which patient described previous swelling. No obvious varicose veins while leg is horizontal.   Skin:    General: Skin is warm and dry.  Neurological:     General: No  focal deficit present.     Mental Status: She is alert.     Comments: Reported decreased sensation of left foot compared to right    ED Results / Procedures / Treatments   Labs (all labs ordered are listed, but only abnormal results are displayed) Labs Reviewed - No data to display  EKG None  Radiology US Venous Img Lower  Left (DVT Study)  Result Date: 09/01/2021 CLINICAL DATA:  Left leg pain and swelling. EXAM: LEFT LOWER EXTREMITY VENOUS DOPPLER ULTRASOUND TECHNIQUE: Gray-scale sonography with compression, as well as color and duplex ultrasound, were performed to evaluate the deep venous system(s) from the level of the common femoral vein through the popliteal and proximal calf veins. COMPARISON:  None Available. FINDINGS: VENOUS Normal compressibility of the common femoral, superficial femoral, and popliteal veins, as well as the visualized calf veins. Visualized portions  of profunda femoral vein and great saphenous vein unremarkable. No filling defects to suggest DVT on grayscale or color Doppler imaging. Doppler waveforms show normal direction of venous flow, normal respiratory plasticity and response to augmentation. Limited views of the contralateral common femoral vein are unremarkable. OTHER Superficial varicosities from greater saphenous vein, thigh to knee. Limitations: none IMPRESSION: No evidence of a left lower extremity deep venous thrombosis. Electronically Signed   By: Amie Portland M.D.   On: 09/01/2021 16:13    Procedures Procedures    Medications Ordered in ED Medications - No data to display  ED Course/ Medical Decision Making/ A&P                           Medical Decision Making This patient is a 31 y.o. female  who presents to the ED for concern of left leg pain and swelling.   Differential diagnoses prior to evaluation: The emergent differential diagnosis includes, but is not limited to,  muscle strain, ligamentous injury, fracture/dislocation, cellulitis, DVT, varicose veins.  This is not an exhaustive differential.   Past Medical History / Co-morbidities: Currently [redacted] weeks pregnant, G2P1  Physical Exam: Physical exam performed. The pertinent findings include: Slight discoloration of the left medial knee along pattern in which patient described previous swelling. No swelling at this time. Compartments are soft, no evidence of septic joint.   Lab Tests/Imaging studies: I Ordered, and personally interpreted labs/imaging including DVT study left lower leg.  The pertinent results include:  no evidence of DVT, superficial varicosities from greater saphenous vein (thigh to knee). I agree with the radiologist interpretation.   Disposition: After consideration of the diagnostic results and the patients response to treatment, I feel that patient is not requiring admission or inpatient treatment for her symptoms. Symptoms related to new  varicose veins vs superficial thrombophlebitis without overlying cellulitis. Will recommend anti-inflammatories, and compression stockings.  Discussed follow-up with PCP if symptoms persist for possible repeat ultrasound in 7-10 days. discussed reasons to return to the emergency department, and the patient is agreeable to the plan.   Final Clinical Impression(s) / ED Diagnoses Final diagnoses:  Left leg pain  Varicose veins during pregnancy    Rx / DC Orders ED Discharge Orders     None      Portions of this report may have been transcribed using voice recognition software. Every effort was made to ensure accuracy; however, inadvertent computerized transcription errors may be present.    Jeanella Flattery 09/01/21 1710    Terrilee Files, MD 09/02/21 1021

## 2021-09-11 ENCOUNTER — Emergency Department (HOSPITAL_BASED_OUTPATIENT_CLINIC_OR_DEPARTMENT_OTHER): Payer: BC Managed Care – PPO

## 2021-09-11 ENCOUNTER — Other Ambulatory Visit: Payer: Self-pay

## 2021-09-11 ENCOUNTER — Encounter (HOSPITAL_BASED_OUTPATIENT_CLINIC_OR_DEPARTMENT_OTHER): Payer: Self-pay | Admitting: Emergency Medicine

## 2021-09-11 ENCOUNTER — Other Ambulatory Visit (HOSPITAL_COMMUNITY): Payer: Self-pay | Admitting: Obstetrics and Gynecology

## 2021-09-11 ENCOUNTER — Emergency Department (HOSPITAL_BASED_OUTPATIENT_CLINIC_OR_DEPARTMENT_OTHER)
Admission: EM | Admit: 2021-09-11 | Discharge: 2021-09-11 | Disposition: A | Discharge status: Home / Self Care | Payer: BC Managed Care – PPO | Attending: Emergency Medicine | Admitting: Emergency Medicine

## 2021-09-11 DIAGNOSIS — O2200 Varicose veins of lower extremity in pregnancy, unspecified trimester: Secondary | ICD-10-CM | POA: Insufficient documentation

## 2021-09-11 DIAGNOSIS — M7989 Other specified soft tissue disorders: Secondary | ICD-10-CM

## 2021-09-11 DIAGNOSIS — Z3A00 Weeks of gestation of pregnancy not specified: Secondary | ICD-10-CM | POA: Insufficient documentation

## 2021-09-11 DIAGNOSIS — M79605 Pain in left leg: Secondary | ICD-10-CM | POA: Insufficient documentation

## 2021-09-11 NOTE — Discharge Instructions (Signed)
It was a pleasure taking care of you today!  Your ultrasound did not show a blood clot to your leg.  You may continue using your compression stockings as previously directed.  Ensure that you are elevating your legs above the level of the heart throughout the day and at nighttime.  You may follow-up with your primary care provider as needed.  Return to the ED if you are experiencing increasing/worsening leg pain, chest pain, trouble breathing, worsening symptoms.

## 2021-09-11 NOTE — ED Notes (Signed)
Awaiting MSE at this time. Provider made aware. Patient made aware. No acute distress noted at this time.

## 2021-09-11 NOTE — ED Provider Notes (Signed)
[facility] EMERGENCY DEPARTMENT Provider Note   CSN: [id number] Arrival date & time: 09/11/21  1225     History  Chief Complaint  Patient presents with   Leg Pain    Latoya Martin is a 31 y.o. female with a past medical history of varicose veins who presents to the emergency department complaining of left leg pain onset 09/01/21.  She notes that she has been utilizing a compression stockings and elevating her leg however has continued varicose veins symptoms that she noticed predominately in the morning.  No meds tried prior to arrival.  Called her OB/GYN today and they told her to come into the ED to rule out a DVT study.  Patient was recently evaluated in the ED 1 week ago and had a negative DVT study.  No anticoagulants at this time.  Denies fever, chills, color change, wound.     The history is provided by the patient. No language interpreter was used.      Home Medications Prior to Admission medications   Medication Sig Start Date End Date Taking? Authorizing Provider  acetaminophen (TYLENOL) 500 MG tablet Take 1,000 mg by mouth every 6 (six) hours as needed.    [provider]  Prenatal Vit-Fe Fumarate-FA (MULTIVITAMIN-PRENATAL) 27-0.8 MG TABS tablet Take 1 tablet by mouth daily at 12 noon.    [provider]      Allergies    Patient has no known allergies.    Review of Systems   Review of Systems  Constitutional:  Negative for chills and fever.  Musculoskeletal:  Negative for joint swelling.  Skin:  Negative for color change and wound.  All other systems reviewed and are negative.  Physical Exam Updated Vital Signs BP (!) 101/58 (BP Location: Right Arm)   Pulse 84   Temp 98.6 F (37 C) (Oral)   Resp 16   Ht 5\' 7"  (1.702 m)   Wt 61.7 kg   LMP 02/04/2021   SpO2 100%   BMI 21.30 kg/m  Physical Exam Vitals and nursing note reviewed.  Constitutional:      General: She is not in acute distress.    Appearance: She is not  diaphoretic.  HENT:     Head: Normocephalic and atraumatic.     Mouth/Throat:     Pharynx: No oropharyngeal exudate.  Eyes:     General: No scleral icterus.    Conjunctiva/sclera: Conjunctivae normal.  Cardiovascular:     Rate and Rhythm: Normal rate and regular rhythm.     Pulses: Normal pulses.     Heart sounds: Normal heart sounds.  Pulmonary:     Effort: Pulmonary effort is normal. No respiratory distress.     Breath sounds: Normal breath sounds. No wheezing.  Abdominal:     Palpations: Abdomen is soft.     Tenderness: There is no abdominal tenderness.  Musculoskeletal:        General: Normal range of motion.     Cervical back: Normal range of motion and neck supple.     Comments: Varicose veins noted to left medial thigh.  No tenderness to palpation noted to the area.  No overlying skin changes.  Pedal pulses intact bilaterally.  Skin:    General: Skin is warm and dry.  Neurological:     Mental Status: She is alert.  Psychiatric:        Behavior: Behavior normal.    ED Results / Procedures / Treatments   Labs (all labs ordered are listed,  but only abnormal results are displayed) Labs Reviewed - No data to display  EKG None  Radiology US Venous Img Lower  Left (DVT Study)  Result Date: 09/11/2021 CLINICAL DATA:  Pain, history of varicose veins EXAM: LEFT LOWER EXTREMITY VENOUS DOPPLER ULTRASOUND TECHNIQUE: Gray-scale sonography with compression, as well as color and duplex ultrasound, were performed to evaluate the deep venous system(s) from the level of the common femoral vein through the popliteal and proximal calf veins. COMPARISON:  09/01/2021 FINDINGS: VENOUS Normal compressibility of the common femoral, superficial femoral, and popliteal veins, as well as the visualized calf veins. Visualized portions of profunda femoral vein and great saphenous vein unremarkable. No filling defects to suggest DVT on grayscale or color Doppler imaging. Doppler waveforms show normal  direction of venous flow, normal respiratory plasticity and response to augmentation. Limited views of the contralateral common femoral vein are unremarkable. OTHER None. Limitations: none IMPRESSION: Negative. Electronically Signed   By: Lucrezia Europe M.D.   On: 09/11/2021 16:53    Procedures Procedures    Medications Ordered in ED Medications - No data to display  ED Course/ Medical Decision Making/ A&P                           Medical Decision Making  Pt presents with concerns for increasing varicose veins since her previous visit on 09/01/2021.  Has been utilizing compression stockings and elevation with no relief in her symptoms.  Vital signs stable, patient afebrile, not tachycardic or hypoxic.  No acute cardiovascular respiratory exam findings. Varicose veins noted to left medial thigh.  No tenderness to palpation noted to the area.  No overlying skin changes.  Pedal pulses intact bilaterally. Differential diagnosis includes DVT, varicose veins.   Additional history obtained:  External records from outside source obtained and reviewed including: Patient was evaluated in the emergency department on 09/01/2021 for similar concerns.  At that time was found to have a negative DVT study.  Advised to use compression stockings and elevate her leg.   Imaging: I ordered imaging studies including DVT ultrasound study I independently visualized and interpreted imaging which showed: Negative for acute DVT I agree with the radiologist interpretation  Disposition: Presentation suspicious for varicose veins in pregnancy.  Doubt DVT.  After consideration of the diagnostic results and the patients response to treatment, I feel that the patient would benefit from Discharge home. Supportive care measures and strict return precautions discussed with patient at bedside. Pt acknowledges and verbalizes understanding. Pt appears safe for discharge. Follow up as indicated in discharge paperwork.    This  chart was dictated using voice recognition software, Dragon. Despite the best efforts of this provider to proofread and correct errors, errors may still occur which can change documentation meaning.  Final Clinical Impression(s) / ED Diagnoses Final diagnoses:  Varicose veins during pregnancy    Rx / DC Orders ED Discharge Orders     None         Armandina Iman A, PA-C 09/11/21 1830    Isla Pence, MD 09/12/21 [phone]

## 2021-09-11 NOTE — ED Triage Notes (Signed)
Pt c/o continued pain to LLE; dx w/ varicose veins, but says pain is worse

## 2021-09-12 ENCOUNTER — Encounter (HOSPITAL_COMMUNITY): Payer: BC Managed Care – PPO

## 2021-11-19 ENCOUNTER — Encounter (HOSPITAL_COMMUNITY): Payer: Self-pay | Admitting: Obstetrics & Gynecology

## 2021-11-19 ENCOUNTER — Inpatient Hospital Stay (HOSPITAL_COMMUNITY)
Admission: AD | Admit: 2021-11-19 | Discharge: 2021-11-19 | Disposition: A | Discharge status: Home / Self Care | Payer: BC Managed Care – PPO | Attending: Obstetrics & Gynecology | Admitting: Obstetrics & Gynecology

## 2021-11-19 DIAGNOSIS — O2213 Genital varices in pregnancy, third trimester: Secondary | ICD-10-CM | POA: Diagnosis not present

## 2021-11-19 DIAGNOSIS — O368130 Decreased fetal movements, third trimester, not applicable or unspecified: Secondary | ICD-10-CM | POA: Diagnosis not present

## 2021-11-19 DIAGNOSIS — O2203 Varicose veins of lower extremity in pregnancy, third trimester: Secondary | ICD-10-CM | POA: Diagnosis not present

## 2021-11-19 DIAGNOSIS — Z3A28 28 weeks gestation of pregnancy: Secondary | ICD-10-CM | POA: Insufficient documentation

## 2021-11-19 DIAGNOSIS — O2200 Varicose veins of lower extremity in pregnancy, unspecified trimester: Secondary | ICD-10-CM | POA: Diagnosis not present

## 2021-11-19 DIAGNOSIS — R102 Pelvic and perineal pain: Secondary | ICD-10-CM | POA: Insufficient documentation

## 2021-11-19 DIAGNOSIS — R110 Nausea: Secondary | ICD-10-CM | POA: Diagnosis not present

## 2021-11-19 DIAGNOSIS — O26893 Other specified pregnancy related conditions, third trimester: Secondary | ICD-10-CM | POA: Insufficient documentation

## 2021-11-19 LAB — URINALYSIS, ROUTINE W REFLEX MICROSCOPIC
Bilirubin Urine: NEGATIVE
Glucose, UA: NEGATIVE mg/dL
Hgb urine dipstick: NEGATIVE
Ketones, ur: NEGATIVE mg/dL
Nitrite: NEGATIVE
Protein, ur: NEGATIVE mg/dL
Specific Gravity, Urine: 1.008 (ref 1.005–1.030)
pH: 6 (ref 5.0–8.0)

## 2021-11-19 LAB — AMNISURE RUPTURE OF MEMBRANE (ROM) NOT AT ARMC: Amnisure ROM: NEGATIVE

## 2021-11-19 MED ORDER — CYCLOBENZAPRINE HCL 10 MG PO TABS: 10.0000 mg | ORAL_TABLET | Freq: Two times a day (BID) | ORAL | 0 refills | Status: DC | PRN

## 2021-11-19 MED ORDER — LACTATED RINGERS IV BOLUS
1000.0000 mL | Freq: Once | INTRAVENOUS | Status: AC
  Administered 2021-11-19: 1000 mL via INTRAVENOUS

## 2021-11-19 MED ORDER — CYCLOBENZAPRINE HCL 5 MG PO TABS
10.0000 mg | ORAL_TABLET | Freq: Once | ORAL | Status: AC
  Administered 2021-11-19: 10 mg via ORAL
  Filled 2021-11-19: qty 2

## 2021-11-19 MED ORDER — ONDANSETRON HCL 4 MG/2ML IJ SOLN
4.0000 mg | Freq: Once | INTRAMUSCULAR | Status: AC
  Administered 2021-11-19: 4 mg via INTRAVENOUS
  Filled 2021-11-19: qty 2

## 2021-11-19 NOTE — Discharge Instructions (Signed)
Round Ligament Massage & Stretches  Massage: Starting at the middle of your pubic bone, trace little circles in a wide U from your pubic bone to your hip bones on both sides.  Then starting just above your pubic bone, press in and down, alternating sides to create a gentle rocking of your uterus back and forth.  Move your hands up the sides of your belly and back down. Do this 3-5 times upon waking and before bed.  Stretches: Get on hands and knees and alternate arching your back deeply while inhaling, and then rounding your back while exhaling. Modified runners lunge:  - Sit on a chair with half of your bottom on the chair and half off.  - Sit up tall, plant your front foot, and stretch your other foot out behind you.  - Breathe deeply for 5 breaths and then do the other side.    PREGNANCY SUPPORT BELT: You are not alone, Seventy-five percent of women have some sort of abdominal or back pain at some point in their pregnancy. Your baby is growing at a fast pace, which means that your whole body is rapidly trying to adjust to the changes. As your uterus grows, your back may start feeling a bit under stress and this can result in back or abdominal pain that can go from mild, and therefore bearable, to severe pains that will not allow you to sit or lay down comfortably, When it comes to dealing with pregnancy-related pains and cramps, some pregnant women usually prefer natural remedies, which the market is filled with nowadays. For example, wearing a pregnancy support belt can help ease and lessen your discomfort and pain. WHAT ARE THE BENEFITS OF WEARING A PREGNANCY SUPPORT BELT? A pregnancy support belt provides support to the lower portion of the belly taking some of the weight of the growing uterus and distributing to the other parts of your body. It is designed make you comfortable and gives you extra support. Over the years, the pregnancy apparel market has been studying the needs and wants of  pregnant women and they have come up with the most comfortable pregnancy support belts that woman could ever ask for. In fact, you will no longer have to wear a stretched-out or bulky pregnancy belt that is visible underneath your clothes and makes you feel even more uncomfortable. Nowadays, a pregnancy support belt is made of comfortable and stretchy materials that will not irritate your skin but will actually make you feel at ease and you will not even notice you are wearing it. They are easy to put on and adjust during the day and can be worn at night for additional support.  BENEFITS: Relives Back pain Relieves Abdominal Muscle and Leg Pain Stabilizes the Pelvic Ring Offers a Cushioned Abdominal Lift Pad Relieves pressure on the Sciatic Nerve Within Minutes WHERE TO GET YOUR PREGNANCY BELT: [facility] [phone] @[address] [place], [place] [address]  [facility]  [address], [place], [place] [address]  [phone]  [facility]  [address] [place], [place] [address]  [phone]  [facility]  [address] [place], [place] [address]  [phone]  [facility]  [address], [place], [place] [address]  [phone]  

## 2021-11-19 NOTE — MAU Provider Note (Signed)
History     CSN: [id number]  Arrival date and time: 11/19/21 1921   Event Date/Time   First Provider Initiated Contact with Patient 11/19/21 2007      No chief complaint on file.  Latoya Martin, a  31 y.o. G2P0101 at [redacted]w[redacted]d presents to MAU with complaints of leaking of fluid, decreased fetal movement and pelvic pain. Upon arrival patient states since 6pm she has been having intermittent pelvic pain feel like she needs to "push." She endorses nausea without vomiting. Currently rates pain a 7/10. Describes pain as burning.  Patient states she went to the bathroom and felt trickling after urination. Denies vaginal itching, burning with urination and abnormal discharge. Last intercourse was "2days ago". Denies attempting relief. Pain worsens with movement. Patient states she has not felt baby move "as much as normal" but has felt some small movements since arrival. She denies urinary symptoms. Last BM was today and normal per patient. States she is unsure of contractions.            OB History     Gravida  2   Para  1   Term      Preterm  1   AB      Living  1      SAB      IAB      Ectopic      Multiple  0   Live Births  1           Past Medical History:  Diagnosis Date   Crouzon syndrome    Headache    Vaginal Pap smear, abnormal     Past Surgical History:  Procedure Laterality Date   head surgery      crouzon syndrome   NO PAST SURGERIES     WISDOM TOOTH EXTRACTION      History reviewed. No pertinent family history.  Social History   Tobacco Use   Smoking status: Never  Vaping Use   Vaping Use: Never used  Substance Use Topics   Alcohol use: Not Currently    Comment: not while preg   Drug use: No    Allergies: No Known Allergies  Medications Prior to Admission  Medication Sig Dispense Refill Last Dose   ferrous sulfate 325 (65 FE) MG tablet Take 325 mg by mouth daily with breakfast.   11/19/2021   Prenatal Vit-Fe Fumarate-FA  (MULTIVITAMIN-PRENATAL) 27-0.8 MG TABS tablet Take 1 tablet by mouth daily at 12 noon.   11/19/2021   acetaminophen (TYLENOL) 500 MG tablet Take 1,000 mg by mouth every 6 (six) hours as needed.       Review of Systems  Constitutional:  Negative for chills, fatigue and fever.  Eyes:  Negative for pain and visual disturbance.  Respiratory:  Negative for apnea, shortness of breath and wheezing.   Cardiovascular:  Negative for chest pain and palpitations.  Gastrointestinal:  Positive for nausea. Negative for abdominal pain, constipation, diarrhea and vomiting.  Genitourinary:  Positive for flank pain and urgency. Negative for difficulty urinating, dysuria, frequency, pelvic pain, vaginal bleeding, vaginal discharge and vaginal pain.  Musculoskeletal:  Negative for back pain.  Neurological:  Negative for seizures, weakness and headaches.  Psychiatric/Behavioral:  Negative for suicidal ideas. The patient is nervous/anxious.    Physical Exam   Blood pressure 120/74, pulse 86, temperature 98 F (36.7 C), temperature source Oral, resp. rate 18, last menstrual period 02/04/2021, SpO2 100 %, unknown if currently breastfeeding.  Physical Exam Vitals (Upon arrival patient  bearing down) and nursing note reviewed. Exam conducted with a chaperone present.  Constitutional:      General: She is in acute distress.  HENT:     Head: Normocephalic.  Cardiovascular:     Rate and Rhythm: Normal rate.  Pulmonary:     Effort: Pulmonary effort is normal.  Abdominal:     Tenderness: There is abdominal tenderness. There is no right CVA tenderness or left CVA tenderness.     Comments: When not bearing down; abdomen soft. Fetal movement present   Genitourinary:    Comments: Complaints of tenderness over mons and pelvis.  Varicose veins noted on left labia Musculoskeletal:     Cervical back: Normal range of motion.  Skin:    General: Skin is warm and dry.  Neurological:     Mental Status: She is alert and  oriented to person, place, and time.  Psychiatric:        Mood and Affect: Mood normal.    FHT: 135 bpm with moderate variability.  Tracing appropriate for gestational age.  UI noted on Toco   1cm/long/ballottable  S. Suzie Portela CNM   MAU Course  Procedures Orders Placed This Encounter  Procedures   Urinalysis, Routine w reflex microscopic Urine, Clean Catch   Amnisure rupture of membrane (rom)not at [facility]   Insert peripheral IV   Meds ordered this encounter  Medications   lactated ringers bolus 1,000 mL   ondansetron (ZOFRAN) injection 4 mg   cyclobenzaprine (FLEXERIL) tablet 10 mg   Results for orders placed or performed during the hospital encounter of 11/19/21 (from the past 24 hour(s))  Urinalysis, Routine w reflex microscopic Urine, Clean Catch     Status: Abnormal   Collection Time: 11/19/21  8:19 PM  Result Value Ref Range   Color, Urine YELLOW YELLOW   APPearance CLOUDY (A) CLEAR   Specific Gravity, Urine 1.008 1.005 - 1.030   pH 6.0 5.0 - 8.0   Glucose, UA NEGATIVE NEGATIVE mg/dL   Hgb urine dipstick NEGATIVE NEGATIVE   Bilirubin Urine NEGATIVE NEGATIVE   Ketones, ur NEGATIVE NEGATIVE mg/dL   Protein, ur NEGATIVE NEGATIVE mg/dL   Nitrite NEGATIVE NEGATIVE   Leukocytes,Ua LARGE (A) NEGATIVE   RBC / HPF 0-5 0 - 5 RBC/hpf   WBC, UA 21-50 0 - 5 WBC/hpf   Bacteria, UA MANY (A) NONE SEEN   Squamous Epithelial / LPF 21-50 0 - 5   Mucus PRESENT     MDM ***  Assessment and Plan  ***  Claudette Head 11/19/2021, 8:07 PM

## 2021-11-19 NOTE — MAU Note (Signed)
.  Latoya Martin is a 31 y.o. at [redacted]w[redacted]d here in MAU reporting: has had pelvic pressure and pain for a couple of weeks now, but today it got worse around 1800, along with some back pain. Pt reports feeling like she is pushing since 1800 as well. Pt states she has been leaking clear fluid while she was on her way to the hospital. Denies VB. States she does not know what ctx feel like, but reports having some tightening earlier this afternoon. States she has not felt baby move since this afternoon and he is normally very active around this time.   Onset of complaint: 1800 Pain score: 7 Vitals:   11/19/21 2001  BP: 120/74  Pulse: 86  Resp: 18  Temp: 98 F (36.7 C)  SpO2: 100%     FHT:EFM applied in room FHR 130s Lab orders placed from triage:  UA

## 2021-11-20 LAB — CULTURE, OB URINE: Culture: <10000 — AB

## 2021-11-28 ENCOUNTER — Other Ambulatory Visit: Payer: Self-pay

## 2021-11-28 DIAGNOSIS — O99019 Anemia complicating pregnancy, unspecified trimester: Secondary | ICD-10-CM

## 2021-12-02 ENCOUNTER — Encounter: Payer: Self-pay | Admitting: Pulmonary Disease

## 2021-12-02 ENCOUNTER — Telehealth: Payer: Self-pay | Admitting: Pharmacy Technician

## 2021-12-02 DIAGNOSIS — D500 Iron deficiency anemia secondary to blood loss (chronic): Secondary | ICD-10-CM | POA: Insufficient documentation

## 2021-12-02 NOTE — Telephone Encounter (Signed)
Auth Submission: NO AUTH NEEDED Payer: [employer] [place] Medication & CPT/J Code(s) submitted: Feraheme (ferumoxytol) [id number] Route of submission (phone, fax, portal): PHONE Phone # Fax # Auth type: Buy/Bill Units/visits requested: X2 Reference number: [id number] Edwin-B 12/02/21 @10 :23 Approval from: 11/06/21 to 03/04/22   DX: will need to updated to D50.0 DX code:O99.019 not listed/coverd per Rep

## 2021-12-27 ENCOUNTER — Encounter: Payer: Self-pay | Admitting: Pulmonary Disease

## 2022-01-05 ENCOUNTER — Inpatient Hospital Stay (HOSPITAL_COMMUNITY)
Admission: AD | Admit: 2022-01-05 | Discharge: 2022-01-05 | Disposition: A | Discharge status: Home / Self Care | Payer: BC Managed Care – PPO | Attending: Obstetrics and Gynecology | Admitting: Obstetrics and Gynecology

## 2022-01-05 ENCOUNTER — Encounter (HOSPITAL_COMMUNITY): Payer: Self-pay | Admitting: Obstetrics and Gynecology

## 2022-01-05 ENCOUNTER — Other Ambulatory Visit: Payer: Self-pay

## 2022-01-05 DIAGNOSIS — O09213 Supervision of pregnancy with history of pre-term labor, third trimester: Secondary | ICD-10-CM | POA: Insufficient documentation

## 2022-01-05 DIAGNOSIS — Z3A34 34 weeks gestation of pregnancy: Secondary | ICD-10-CM | POA: Insufficient documentation

## 2022-01-05 DIAGNOSIS — O4703 False labor before 37 completed weeks of gestation, third trimester: Secondary | ICD-10-CM | POA: Insufficient documentation

## 2022-01-05 DIAGNOSIS — O4700 False labor before 37 completed weeks of gestation, unspecified trimester: Secondary | ICD-10-CM

## 2022-01-05 LAB — URINALYSIS, ROUTINE W REFLEX MICROSCOPIC
Bilirubin Urine: NEGATIVE
Glucose, UA: NEGATIVE mg/dL
Hgb urine dipstick: NEGATIVE
Ketones, ur: NEGATIVE mg/dL
Leukocytes,Ua: NEGATIVE
Nitrite: NEGATIVE
Protein, ur: NEGATIVE mg/dL
Specific Gravity, Urine: 1.004 — ABNORMAL LOW (ref 1.005–1.030)
pH: 7 (ref 5.0–8.0)

## 2022-01-05 LAB — POCT FERN TEST: POCT Fern Test: NEGATIVE

## 2022-01-05 MED ORDER — LACTATED RINGERS IV BOLUS
1000.0000 mL | Freq: Once | INTRAVENOUS | Status: AC
  Administered 2022-01-05: 1000 mL via INTRAVENOUS

## 2022-01-05 NOTE — MAU Provider Note (Signed)
History     CSN: [id number]  Arrival date and time: 01/05/22 1503   None     Chief Complaint  Patient presents with   Contractions   Abdominal Pain   HPI Latoya Martin is a 31 y.o. G2P0101 at [redacted]w[redacted]d who presents to MAU for contractions and the urge to push. Patient was brought in a wheelchair straight to a room reporting contractions that started this morning and the urge to push. She reports a history of a preterm delivery at 36 weeks. Contractions are every few minutes apart. She denies vaginal bleeding or leaking fluid. No urinary s/s, fever, CVA tenderness, constipation or diarrhea. Endorses normal fetal movement.   Patient receieves prenatal care at [facility].   OB History     Gravida  2   Para  1   Term      Preterm  1   AB      Living  1      SAB      IAB      Ectopic      Multiple  0   Live Births  1           Past Medical History:  Diagnosis Date   Crouzon syndrome    Headache    Vaginal Pap smear, abnormal     Past Surgical History:  Procedure Laterality Date   head surgery      crouzon syndrome   NO PAST SURGERIES     WISDOM TOOTH EXTRACTION      History reviewed. No pertinent family history.  Social History   Tobacco Use   Smoking status: Never  Vaping Use   Vaping Use: Never used  Substance Use Topics   Alcohol use: Not Currently    Comment: not while preg   Drug use: No    Allergies: No Known Allergies  No medications prior to admission.    Review of Systems  Constitutional: Negative.   Respiratory: Negative.    Cardiovascular: Negative.   Gastrointestinal:  Positive for abdominal pain (contractions). Negative for constipation and rectal pain.  Genitourinary:  Positive for pelvic pain. Negative for dysuria, vaginal bleeding and vaginal discharge.  Musculoskeletal: Negative.   Neurological: Negative.    Physical Exam   Patient Vitals for the past 24 hrs:  BP Temp Pulse Resp  01/05/22 1759  113/64 -- 83 --  01/05/22 1521 -- 97.8 F (36.6 C) -- --  01/05/22 1517 122/62 -- (!) 108 17   Physical Exam Vitals and nursing note reviewed. Exam conducted with a chaperone present.  Constitutional:      General: She is not in acute distress. Cardiovascular:     Rate and Rhythm: Tachycardia present.  Pulmonary:     Effort: Pulmonary effort is normal.  Abdominal:     Palpations: Abdomen is soft.     Tenderness: There is no abdominal tenderness.     Comments: Gravid   Neurological:     General: No focal deficit present.     Mental Status: She is alert and oriented to person, place, and time.  Psychiatric:        Mood and Affect: Mood normal.        Behavior: Behavior normal.   Dilation: 1 Effacement (%): Thick Exam by:: Rhonin Trott Simspon, CNM  NST FHR: 130 bpm, moderate variability, +15x15 accels, no decels Toco: ui  Fern: negative MAU Course  Procedures  MDM UA LR bolus  UA negative. Cervix 1/thick. Patient was given  1 liter of LR. Per RN, patient called out stating she was leaking fluid and pelvic pressure was worse. CNM did a sterile spec exam without any evidence of pooling of amniotic fluid. Fern slide was negative. Cervix remained 1/thick. Patient given another liter of LR. Approximately 3 hours after initial cervical exam, patient was recheck and remains 1/thick. Patient reports pain is 0/10. Low suspicion for preterm labor  Assessment and Plan  [redacted] weeks gestation of pregnancy Preterm uterine contractions  - Discharge home in stable condition - Strict return precautions - Keep OB appointment as scheduled - Return to MAU as needed for new/worsening symptoms   Renee Harder, CNM 01/05/2022, 7:32 PM

## 2022-01-08 ENCOUNTER — Inpatient Hospital Stay (HOSPITAL_COMMUNITY)
Admission: AD | Admit: 2022-01-08 | Discharge: 2022-01-08 | Disposition: A | Discharge status: Home / Self Care | Payer: BC Managed Care – PPO | Attending: Obstetrics and Gynecology | Admitting: Obstetrics and Gynecology

## 2022-01-08 ENCOUNTER — Encounter (HOSPITAL_COMMUNITY): Payer: Self-pay | Admitting: Obstetrics and Gynecology

## 2022-01-08 DIAGNOSIS — M549 Dorsalgia, unspecified: Secondary | ICD-10-CM | POA: Diagnosis not present

## 2022-01-08 DIAGNOSIS — Z3A35 35 weeks gestation of pregnancy: Secondary | ICD-10-CM | POA: Diagnosis not present

## 2022-01-08 DIAGNOSIS — O99891 Other specified diseases and conditions complicating pregnancy: Secondary | ICD-10-CM

## 2022-01-08 DIAGNOSIS — O26893 Other specified pregnancy related conditions, third trimester: Secondary | ICD-10-CM | POA: Insufficient documentation

## 2022-01-08 DIAGNOSIS — O4703 False labor before 37 completed weeks of gestation, third trimester: Secondary | ICD-10-CM | POA: Insufficient documentation

## 2022-01-08 DIAGNOSIS — O4700 False labor before 37 completed weeks of gestation, unspecified trimester: Secondary | ICD-10-CM

## 2022-01-08 MED ORDER — CYCLOBENZAPRINE HCL 5 MG PO TABS
10.0000 mg | ORAL_TABLET | Freq: Once | ORAL | Status: AC
  Administered 2022-01-08: 10 mg via ORAL
  Filled 2022-01-08: qty 2

## 2022-01-08 MED ORDER — CYCLOBENZAPRINE HCL 10 MG PO TABS: 10.0000 mg | ORAL_TABLET | Freq: Three times a day (TID) | ORAL | 0 refills | Status: DC | PRN

## 2022-01-08 NOTE — MAU Note (Signed)
...  Latoya Martin is a 31 y.o. at [redacted]w[redacted]d here in MAU reporting: Patient arrived to MAU with reports of feelings of needing to push. CTX since 1600 that are "nonstop." She reports she has been leaking fluids since then as well. Denies VB. +FM.  She reports her pain is constant and she does not feel as if she has a break from any pain. She reports it just increases at times.   Onset of complaint: 1600 today Pain score: 10/10 lower abdomen  FHT: 138 initial external

## 2022-01-08 NOTE — MAU Provider Note (Signed)
Chief Complaint:  Contractions   Event Date/Time   First Provider Initiated Contact with Patient 01/08/22 1832      HPI: Latoya Martin is a 31 y.o. G2P0101 at [redacted]w[redacted]d who presents to maternity admissions reporting feeling frequent contractions and an urge to push and wet underwear, but not enough fluid to require a pad.   She reports good fetal movement.   HPI  Past Medical History: Past Medical History:  Diagnosis Date   Crouzon syndrome    Headache    Vaginal Pap smear, abnormal     Past obstetric history: OB History  Gravida Para Term Preterm AB Living  2 1   1   1   SAB IAB Ectopic Multiple Live Births        0 1    # Outcome Date GA Lbr Len/2nd Weight Sex Delivery Anes PTL Lv  2 Current           1 Preterm 10/08/15 [redacted]w[redacted]d 02:55 / 00:23 3184 g M Vag-Spont EPI  LIV     Birth Comments: none    Past Surgical History: Past Surgical History:  Procedure Laterality Date   head surgery      crouzon syndrome   NO PAST SURGERIES     WISDOM TOOTH EXTRACTION      Family History: History reviewed. No pertinent family history.  Social History: Social History   Tobacco Use   Smoking status: Never  Vaping Use   Vaping Use: Never used  Substance Use Topics   Alcohol use: Not Currently    Comment: not while preg   Drug use: No    Allergies: No Known Allergies  Meds:  Medications Prior to Admission  Medication Sig Dispense Refill Last Dose   Prenatal Vit-Fe Fumarate-FA (MULTIVITAMIN-PRENATAL) 27-0.8 MG TABS tablet Take 1 tablet by mouth daily at 12 noon.   01/07/2022   acetaminophen (TYLENOL) 500 MG tablet Take 1,000 mg by mouth every 6 (six) hours as needed.      cyclobenzaprine (FLEXERIL) 10 MG tablet Take 1 tablet (10 mg total) by mouth 2 (two) times daily as needed for muscle spasms. 20 tablet 0    ferrous sulfate 325 (65 FE) MG tablet Take 325 mg by mouth daily with breakfast.       ROS:  Review of Systems  Constitutional:  Negative for chills, fatigue and  fever.  Eyes:  Negative for visual disturbance.  Respiratory:  Negative for shortness of breath.   Cardiovascular:  Negative for chest pain.  Gastrointestinal:  Positive for abdominal pain. Negative for nausea and vomiting.  Genitourinary:  Positive for pelvic pain and vaginal discharge. Negative for difficulty urinating, dysuria, flank pain, vaginal bleeding and vaginal pain.  Neurological:  Negative for dizziness and headaches.  Psychiatric/Behavioral: Negative.       I have reviewed patient's Past Medical Hx, Surgical Hx, Family Hx, Social Hx, medications and allergies.   Physical Exam  Patient Vitals for the past 24 hrs:  BP Temp Temp src Pulse Resp SpO2 Height  01/08/22 1806 128/71 97.7 F (36.5 C) Oral 91 16 100 % 5\' 7"  (1.702 m)   Constitutional: Well-developed, well-nourished female in no acute distress.  Cardiovascular: normal rate Respiratory: normal effort GI: Abd soft, non-tender, gravid appropriate for gestational age.  MS: Extremities nontender, no edema, normal ROM Neurologic: Alert and oriented x 4.  GU: Neg CVAT.  PELVIC EXAM: Cervix pink, visually closed, without lesion, scant white creamy discharge, vaginal walls and external genitalia normal  Dilation: 1 Effacement (%): Thick Station: -3 Exam by:: Sharen Counter, CNM  FHT:  Baseline 135 , moderate variability, accelerations present, no decelerations Contractions: irritability, difficult to assess with pt bearing down/tensing abdomen   Labs: No results found for this or any previous visit (from the past 24 hour(s)).     Imaging:  No results found.  MAU Course/MDM: Orders Placed This Encounter  Procedures   Urinalysis, Routine w reflex microscopic Urine, Clean Catch    Meds ordered this encounter  Medications   cyclobenzaprine (FLEXERIL) tablet 10 mg     NST reviewed and reactive Cervix unchanged at 1cm/long from previous exams Scant fluid, no leaking on pad in MAU Flexeril given for  likely MSK pain Will reevaluate cervix in 1-2 hours Cervix unchanged, pt with improved symptoms and desires discharge F/U as scheduled in the office Return to MAU as needed for emergencies  Assessment:  1. Preterm uterine contractions   2. Back pain affecting pregnancy in third trimester   3. [redacted] weeks gestation of pregnancy     Plan: Discharge home Labor precautions and fetal kick counts  Allergies as of 01/08/2022   No Known Allergies      Medication List     TAKE these medications    acetaminophen 500 MG tablet Commonly known as: TYLENOL Take 1,000 mg by mouth every 6 (six) hours as needed.   cyclobenzaprine 10 MG tablet Commonly known as: FLEXERIL Take 1 tablet (10 mg total) by mouth every 8 (eight) hours as needed for muscle spasms. What changed: when to take this   ferrous sulfate 325 (65 FE) MG tablet Take 325 mg by mouth daily with breakfast.   multivitamin-prenatal 27-0.8 MG Tabs tablet Take 1 tablet by mouth daily at 12 noon.         Sharen Counter Certified Nurse-Midwife 01/08/2022 6:37 PM

## 2022-01-16 LAB — OB RESULTS CONSOLE GBS: GBS: NEGATIVE

## 2022-01-20 ENCOUNTER — Inpatient Hospital Stay (HOSPITAL_COMMUNITY)
Admission: AD | Admit: 2022-01-20 | Discharge: 2022-01-20 | Disposition: A | Discharge status: Home / Self Care | Payer: BC Managed Care – PPO | Attending: Obstetrics and Gynecology | Admitting: Obstetrics and Gynecology

## 2022-01-20 ENCOUNTER — Encounter (HOSPITAL_COMMUNITY): Payer: Self-pay | Admitting: Obstetrics and Gynecology

## 2022-01-20 DIAGNOSIS — Z3A37 37 weeks gestation of pregnancy: Secondary | ICD-10-CM | POA: Diagnosis not present

## 2022-01-20 DIAGNOSIS — O471 False labor at or after 37 completed weeks of gestation: Secondary | ICD-10-CM | POA: Diagnosis present

## 2022-01-20 DIAGNOSIS — O479 False labor, unspecified: Secondary | ICD-10-CM | POA: Diagnosis not present

## 2022-01-20 DIAGNOSIS — Z3493 Encounter for supervision of normal pregnancy, unspecified, third trimester: Secondary | ICD-10-CM

## 2022-01-20 LAB — POCT FERN TEST: POCT Fern Test: NEGATIVE

## 2022-01-20 NOTE — MAU Note (Signed)
I have communicated with Maryagnes Amos, CNM and reviewed vital signs:  Vitals:   01/20/22 0206 01/20/22 0300  BP: 119/69 114/65  Pulse: 100 90  Resp:    Temp:    SpO2: 97% 99%    Vaginal exam:  Dilation: 3 Effacement (%): Thick Exam by:: Herb Grays, RN,   Also reviewed contraction pattern and that non-stress test is reactive.  It has been documented that patient is contracting every occasionally with no cervical change over 1 hour not indicating active labor.  Patient denies any other complaints.  Based on this report provider has given order for discharge.  A discharge order and diagnosis entered by a provider.   Labor discharge instructions reviewed with patient. Patient verbalized understanding on when to return to the hospital.

## 2022-01-20 NOTE — MAU Note (Signed)
.  Latoya Martin is a 31 y.o. at [redacted]w[redacted]d here in MAU reporting: ctx that started around 2130 last night and have increased in intensity since then (10/10). She also reports leaking of clear, watery fluid at 0100. Denies VB. Reports good FM. LMP: N/A Onset of complaint: Last night Pain score: 10/10 Vitals:   01/20/22 0150  BP: 125/71  Pulse: 93  Resp: (!) 24  Temp: 98 F (36.7 C)  SpO2: 99%     FHT:145 Lab orders placed from triage:  MAU labor

## 2022-01-20 NOTE — MAU Provider Note (Signed)
History     CSN: [id number]  Arrival date and time: 01/20/22 0130   None     Chief Complaint  Patient presents with   Rupture of Membranes   Contractions   Labor Eval   HPI Latoya Martin is a 31 y.o. G2P0101 at [redacted]w[redacted]d who presents to MAU for contractions and leaking fluid. Patient reports "back to back" contractions since 9pm. She reprts around 1am she had a lot of diarrhea and then "started peeing a lot". She is concerned that water is possibly broken as there was "a lot of clear watery fluid". She is not having to wear a pad. She has no itching/odor. She denies urinary s/s. No vaginal bleeding. She endorses active fetal movement.  Patient receives prenatal care at [facility], next appointment is scheduled on Friday.    OB History     Gravida  2   Para  1   Term      Preterm  1   AB      Living  1      SAB      IAB      Ectopic      Multiple  0   Live Births  1           Past Medical History:  Diagnosis Date   Crouzon syndrome    Headache    Vaginal Pap smear, abnormal     Past Surgical History:  Procedure Laterality Date   head surgery      crouzon syndrome   NO PAST SURGERIES     WISDOM TOOTH EXTRACTION      History reviewed. No pertinent family history.  Social History   Tobacco Use   Smoking status: Never  Vaping Use   Vaping Use: Never used  Substance Use Topics   Alcohol use: Not Currently    Comment: not while preg   Drug use: No    Allergies: No Known Allergies  Medications Prior to Admission  Medication Sig Dispense Refill Last Dose   cyclobenzaprine (FLEXERIL) 10 MG tablet Take 1 tablet (10 mg total) by mouth every 8 (eight) hours as needed for muscle spasms. 20 tablet 0 01/19/2022   Prenatal Vit-Fe Fumarate-FA (MULTIVITAMIN-PRENATAL) 27-0.8 MG TABS tablet Take 1 tablet by mouth daily at 12 noon.   01/19/2022   acetaminophen (TYLENOL) 500 MG tablet Take 1,000 mg by mouth every 6 (six) hours as needed.       ferrous sulfate 325 (65 FE) MG tablet Take 325 mg by mouth daily with breakfast.      Review of Systems  Constitutional: Negative.   Respiratory: Negative.    Cardiovascular: Negative.   Gastrointestinal:  Positive for abdominal pain (contractions).  Genitourinary:  Positive for vaginal discharge. Negative for dysuria and vaginal bleeding.  Neurological: Negative.    Physical Exam   Patient Vitals for the past 24 hrs:  BP Temp Temp src Pulse Resp SpO2 Height Weight  01/20/22 0300 114/65 -- -- 90 -- 99 % -- --  01/20/22 0206 119/69 -- -- 100 -- 97 % -- --  01/20/22 0150 125/71 98 F (36.7 C) Oral 93 (!) 24 99 % 5\' 7"  (1.702 m) 78.5 kg   Physical Exam Vitals and nursing note reviewed. Exam conducted with a chaperone present.  Constitutional:      General: She is not in acute distress. Eyes:     Pupils: Pupils are equal, round, and reactive to light.  Cardiovascular:  Rate and Rhythm: Normal rate.  Pulmonary:     Effort: Pulmonary effort is normal.  Abdominal:     Palpations: Abdomen is soft.     Tenderness: There is no abdominal tenderness.     Comments: Gravid   Genitourinary:    Comments: Normal external female genitalia, vaginal walls pink with rugae, small amount of white/clear mucoid discharge, no pooling of amniotic fluid, no bleeding, cervix visually 2cm without lesions/masses Musculoskeletal:        General: Normal range of motion.     Cervical back: Normal range of motion.  Skin:    General: Skin is warm and dry.  Neurological:     General: No focal deficit present.     Mental Status: She is alert and oriented to person, place, and time.  Psychiatric:        Mood and Affect: Mood normal.        Behavior: Behavior normal.   Dilation: 3 Effacement (%): Thick Exam by:: Herb Grays, RN  NST FHR: 135 bpm, moderate variability, +15x15 accels, no decels Toco: occasional  MAU Course  Procedures  MDM SSE, fern slide  SSE without evidence of  pooling of amniotic fluid, fern slide negative. NST reactive and reassuring. Irregular contractions present. Cervix 3/thick per RN, exam unchanged after 1 hour. Patient's membranes are intact and she is not in active labor. Patient okay with discharge home and strict return precautions.  Assessment and Plan  [redacted] weeks gestation of pregnancy Intact amniotic membranes Braxton hicks contractions  - Discharge home in stable condition - Strict return precautions. Return to MAU as needed for new/worsening symptoms - Keep OB appointment as scheduled on Friday 10/20   Renee Harder, CNM 01/20/2022, 3:14 AM

## 2022-01-31 ENCOUNTER — Telehealth (HOSPITAL_COMMUNITY): Payer: Self-pay | Admitting: *Deleted

## 2022-01-31 NOTE — H&P (Signed)
Latoya Martin is a 31 y.o. female presenting for eIOL with a favorable cervix. Prior SVD at 28 wga of a 7# infant. She has had an uncomplicated pregnancy.  OB History     Gravida  2   Para  1   Term      Preterm  1   AB      Living  1      SAB      IAB      Ectopic      Multiple  0   Live Births  1          Past Medical History:  Diagnosis Date   Crouzon syndrome    Headache    Vaginal Pap smear, abnormal    Past Surgical History:  Procedure Laterality Date   head surgery      crouzon syndrome   NO PAST SURGERIES     WISDOM TOOTH EXTRACTION     Family History: family history is not on file. Social History:  reports that she has never smoked. She does not have any smokeless tobacco history on file. She reports that she does not currently use alcohol. She reports that she does not use drugs.     Maternal Diabetes: No Genetic Screening: Normal RR FEMALE Maternal Ultrasounds/Referrals: Normal Fetal Ultrasounds or other Referrals:  None Maternal Substance Abuse:  No Significant Maternal Medications:  None Significant Maternal Lab Results:  Group B Strep negative Number of Prenatal Visits:greater than 3 verified prenatal visits Other Comments:  None  Review of Systems WNL History   Last menstrual period 02/04/2021, unknown if currently breastfeeding. Exam Physical Exam  (from office) NAD, A&O NWOB Abd soft, nondistended, gravid  Prenatal labs: ABO, Rh:  A+ Antibody:  Neg Rubella:  NON IMMUNE RPR:   NR HBsAg:   NR HIV:   NR GBS: Neg     Assessment/Plan: 31 yo G2P0101 @ 36 wga presenting for IOL s/s favorable cervix at term.. Cervix favorable. Plan for Pitocin/AROM. MMR PP for rubella non-immune. GBS negative.    Tyson Dense 01/31/2022, 12:48 PM

## 2022-01-31 NOTE — Telephone Encounter (Signed)
Preadmission screen  

## 2022-02-03 ENCOUNTER — Inpatient Hospital Stay (HOSPITAL_COMMUNITY)
Admission: RE | Admit: 2022-02-03 | Discharge: 2022-02-04 | DRG: 807 | Disposition: A | Discharge status: Home / Self Care | Payer: BC Managed Care – PPO | Attending: Obstetrics and Gynecology | Admitting: Obstetrics and Gynecology

## 2022-02-03 ENCOUNTER — Inpatient Hospital Stay (HOSPITAL_COMMUNITY): Payer: BC Managed Care – PPO | Admitting: Anesthesiology

## 2022-02-03 ENCOUNTER — Encounter (HOSPITAL_COMMUNITY): Payer: Self-pay | Admitting: Obstetrics and Gynecology

## 2022-02-03 ENCOUNTER — Encounter: Payer: Self-pay | Admitting: Pulmonary Disease

## 2022-02-03 ENCOUNTER — Other Ambulatory Visit: Payer: Self-pay

## 2022-02-03 ENCOUNTER — Inpatient Hospital Stay (HOSPITAL_COMMUNITY): Payer: BC Managed Care – PPO

## 2022-02-03 DIAGNOSIS — Z3A39 39 weeks gestation of pregnancy: Secondary | ICD-10-CM

## 2022-02-03 DIAGNOSIS — Z3490 Encounter for supervision of normal pregnancy, unspecified, unspecified trimester: Principal | ICD-10-CM

## 2022-02-03 DIAGNOSIS — O26893 Other specified pregnancy related conditions, third trimester: Secondary | ICD-10-CM | POA: Diagnosis present

## 2022-02-03 LAB — CBC
HCT: 30.5 % — ABNORMAL LOW (ref 36.0–46.0)
Hemoglobin: 9.5 g/dL — ABNORMAL LOW (ref 12.0–15.0)
MCH: 25.4 pg — ABNORMAL LOW (ref 26.0–34.0)
MCHC: 31.1 g/dL (ref 30.0–36.0)
MCV: 81.6 fL (ref 80.0–100.0)
Platelets: 165 10*3/uL (ref 150–400)
RBC: 3.74 MIL/uL — ABNORMAL LOW (ref 3.87–5.11)
RDW: 16.7 % — ABNORMAL HIGH (ref 11.5–15.5)
WBC: 7.2 10*3/uL (ref 4.0–10.5)
nRBC: 0 % (ref 0.0–0.2)

## 2022-02-03 LAB — TYPE AND SCREEN
ABO/RH(D): A POS
Antibody Screen: NEGATIVE

## 2022-02-03 MED ORDER — OXYCODONE-ACETAMINOPHEN 5-325 MG PO TABS: 1.0000 | ORAL_TABLET | ORAL | Status: DC | PRN

## 2022-02-03 MED ORDER — BENZOCAINE-MENTHOL 20-0.5 % EX AERO: 1.0000 | INHALATION_SPRAY | CUTANEOUS | Status: DC | PRN

## 2022-02-03 MED ORDER — PRENATAL MULTIVITAMIN CH
1.0000 | ORAL_TABLET | Freq: Every day | ORAL | Status: DC
  Administered 2022-02-04: 1 via ORAL
  Filled 2022-02-03: qty 1

## 2022-02-03 MED ORDER — COCONUT OIL OIL: 1.0000 | TOPICAL_OIL | Status: DC | PRN

## 2022-02-03 MED ORDER — PHENYLEPHRINE 80 MCG/ML (10ML) SYRINGE FOR IV PUSH (FOR BLOOD PRESSURE SUPPORT): 80.0000 ug | PREFILLED_SYRINGE | INTRAVENOUS | Status: DC | PRN

## 2022-02-03 MED ORDER — EPHEDRINE 5 MG/ML INJ: 10.0000 mg | INTRAVENOUS | Status: DC | PRN

## 2022-02-03 MED ORDER — OXYCODONE HCL 5 MG PO TABS: 10.0000 mg | ORAL_TABLET | ORAL | Status: DC | PRN

## 2022-02-03 MED ORDER — LACTATED RINGERS IV SOLN: 500.0000 mL | INTRAVENOUS | Status: DC | PRN

## 2022-02-03 MED ORDER — OXYTOCIN-SODIUM CHLORIDE 30-0.9 UT/500ML-% IV SOLN: 2.5000 [IU]/h | INTRAVENOUS | Status: DC

## 2022-02-03 MED ORDER — ZOLPIDEM TARTRATE 5 MG PO TABS: 5.0000 mg | ORAL_TABLET | Freq: Every evening | ORAL | Status: DC | PRN

## 2022-02-03 MED ORDER — OXYCODONE HCL 5 MG PO TABS: 5.0000 mg | ORAL_TABLET | ORAL | Status: DC | PRN

## 2022-02-03 MED ORDER — LIDOCAINE HCL (PF) 1 % IJ SOLN
INTRAMUSCULAR | Status: DC | PRN
  Administered 2022-02-03: 8 mL via EPIDURAL

## 2022-02-03 MED ORDER — ACETAMINOPHEN 325 MG PO TABS: 650.0000 mg | ORAL_TABLET | ORAL | Status: DC | PRN

## 2022-02-03 MED ORDER — DIPHENHYDRAMINE HCL 50 MG/ML IJ SOLN
12.5000 mg | INTRAMUSCULAR | Status: DC | PRN
  Administered 2022-02-03: 12.5 mg via INTRAVENOUS
  Filled 2022-02-03: qty 1

## 2022-02-03 MED ORDER — SIMETHICONE 80 MG PO CHEW: 80.0000 mg | CHEWABLE_TABLET | ORAL | Status: DC | PRN

## 2022-02-03 MED ORDER — IBUPROFEN 600 MG PO TABS
600.0000 mg | ORAL_TABLET | Freq: Four times a day (QID) | ORAL | Status: DC
  Administered 2022-02-03 – 2022-02-04 (×4): 600 mg via ORAL
  Filled 2022-02-03 (×4): qty 1

## 2022-02-03 MED ORDER — ONDANSETRON HCL 4 MG/2ML IJ SOLN: 4.0000 mg | Freq: Four times a day (QID) | INTRAMUSCULAR | Status: DC | PRN

## 2022-02-03 MED ORDER — SOD CITRATE-CITRIC ACID 500-334 MG/5ML PO SOLN: 30.0000 mL | ORAL | Status: DC | PRN

## 2022-02-03 MED ORDER — TERBUTALINE SULFATE 1 MG/ML IJ SOLN: 0.2500 mg | Freq: Once | INTRAMUSCULAR | Status: DC | PRN

## 2022-02-03 MED ORDER — DIBUCAINE (PERIANAL) 1 % EX OINT: 1.0000 | TOPICAL_OINTMENT | CUTANEOUS | Status: DC | PRN

## 2022-02-03 MED ORDER — ACETAMINOPHEN 325 MG PO TABS
650.0000 mg | ORAL_TABLET | ORAL | Status: DC | PRN
  Administered 2022-02-04: 650 mg via ORAL
  Filled 2022-02-03: qty 2

## 2022-02-03 MED ORDER — LIDOCAINE HCL (PF) 1 % IJ SOLN: 30.0000 mL | INTRAMUSCULAR | Status: DC | PRN

## 2022-02-03 MED ORDER — DIPHENHYDRAMINE HCL 25 MG PO CAPS: 25.0000 mg | ORAL_CAPSULE | Freq: Four times a day (QID) | ORAL | Status: DC | PRN

## 2022-02-03 MED ORDER — LACTATED RINGERS IV SOLN: 500.0000 mL | Freq: Once | INTRAVENOUS | Status: DC

## 2022-02-03 MED ORDER — FENTANYL CITRATE (PF) 100 MCG/2ML IJ SOLN: 50.0000 ug | INTRAMUSCULAR | Status: DC | PRN

## 2022-02-03 MED ORDER — OXYTOCIN BOLUS FROM INFUSION
333.0000 mL | Freq: Once | INTRAVENOUS | Status: AC
  Administered 2022-02-03: 333 mL via INTRAVENOUS

## 2022-02-03 MED ORDER — FENTANYL-BUPIVACAINE-NACL 0.5-0.125-0.9 MG/250ML-% EP SOLN
12.0000 mL/h | EPIDURAL | Status: DC | PRN
  Administered 2022-02-03: 12 mL/h via EPIDURAL
  Filled 2022-02-03: qty 250

## 2022-02-03 MED ORDER — WITCH HAZEL-GLYCERIN EX PADS: 1.0000 | MEDICATED_PAD | CUTANEOUS | Status: DC | PRN

## 2022-02-03 MED ORDER — OXYTOCIN-SODIUM CHLORIDE 30-0.9 UT/500ML-% IV SOLN
1.0000 m[IU]/min | INTRAVENOUS | Status: DC
  Administered 2022-02-03: 2 m[IU]/min via INTRAVENOUS
  Filled 2022-02-03: qty 500

## 2022-02-03 MED ORDER — SENNOSIDES-DOCUSATE SODIUM 8.6-50 MG PO TABS
2.0000 | ORAL_TABLET | ORAL | Status: DC
  Administered 2022-02-04: 2 via ORAL
  Filled 2022-02-03: qty 2

## 2022-02-03 MED ORDER — OXYCODONE-ACETAMINOPHEN 5-325 MG PO TABS: 2.0000 | ORAL_TABLET | ORAL | Status: DC | PRN

## 2022-02-03 MED ORDER — HYDROXYZINE HCL 50 MG PO TABS: 50.0000 mg | ORAL_TABLET | Freq: Four times a day (QID) | ORAL | Status: DC | PRN

## 2022-02-03 MED ORDER — ONDANSETRON HCL 4 MG/2ML IJ SOLN: 4.0000 mg | INTRAMUSCULAR | Status: DC | PRN

## 2022-02-03 MED ORDER — LACTATED RINGERS IV SOLN: INTRAVENOUS | Status: DC

## 2022-02-03 MED ORDER — TETANUS-DIPHTH-ACELL PERTUSSIS 5-2.5-18.5 LF-MCG/0.5 IM SUSY: 0.5000 mL | PREFILLED_SYRINGE | Freq: Once | INTRAMUSCULAR | Status: DC

## 2022-02-03 MED ORDER — DIPHENHYDRAMINE HCL 50 MG/ML IJ SOLN: 12.5000 mg | INTRAMUSCULAR | Status: DC | PRN

## 2022-02-03 MED ORDER — FENTANYL-BUPIVACAINE-NACL 0.5-0.125-0.9 MG/250ML-% EP SOLN: 12.0000 mL/h | EPIDURAL | Status: DC | PRN

## 2022-02-03 MED ORDER — ONDANSETRON HCL 4 MG PO TABS: 4.0000 mg | ORAL_TABLET | ORAL | Status: DC | PRN

## 2022-02-03 MED ORDER — MEASLES, MUMPS & RUBELLA VAC IJ SOLR: 0.5000 mL | Freq: Once | INTRAMUSCULAR | Status: DC

## 2022-02-03 NOTE — Progress Notes (Signed)
No changes to H&P. 3.5/50/-2, AROM clear fluid Continue pitocin

## 2022-02-03 NOTE — Progress Notes (Signed)
Comf with cle.  Sve 4/80/-2 per RN Continue Pitocin.

## 2022-02-03 NOTE — Anesthesia Procedure Notes (Signed)
Epidural Patient location during procedure: OB Start time: 02/03/2022 10:00 AM End time: 02/03/2022 10:05 AM  Staffing Anesthesiologist: Janeece Riggers, MD  Preanesthetic Checklist Completed: patient identified, IV checked, site marked, risks and benefits discussed, surgical consent, monitors and equipment checked, pre-op evaluation and timeout performed  Epidural Patient position: sitting Prep: DuraPrep and site prepped and draped Patient monitoring: continuous pulse ox and blood pressure Approach: midline Location: L3-L4 Injection technique: LOR air  Needle:  Needle type: Tuohy  Needle gauge: 17 G Needle length: 9 cm and 9 Needle insertion depth: 5 cm Catheter type: closed end flexible Catheter size: 19 Gauge Catheter at skin depth: 10 cm Test dose: negative  Assessment Events: blood not aspirated, injection not painful, no injection resistance, no paresthesia and negative IV test

## 2022-02-03 NOTE — Anesthesia Preprocedure Evaluation (Signed)
Anesthesia Evaluation  Patient identified by MRN, date of birth, ID band Patient awake    Reviewed: Allergy & Precautions, H&P , NPO status , Patient's Chart, lab work & pertinent test results, reviewed documented beta blocker date and time   Airway Mallampati: II  TM Distance: >3 FB Neck ROM: full    Dental no notable dental hx. (+) Teeth Intact, Dental Advisory Given   Pulmonary neg pulmonary ROS,    Pulmonary exam normal breath sounds clear to auscultation       Cardiovascular negative cardio ROS Normal cardiovascular exam Rhythm:regular Rate:Normal     Neuro/Psych  Headaches, negative psych ROS   GI/Hepatic negative GI ROS, Neg liver ROS,   Endo/Other  negative endocrine ROS  Renal/GU negative Renal ROS  negative genitourinary   Musculoskeletal   Abdominal   Peds  Hematology  (+) Blood dyscrasia, anemia ,   Anesthesia Other Findings   Reproductive/Obstetrics (+) Pregnancy                             Anesthesia Physical Anesthesia Plan  ASA: 3  Anesthesia Plan: Epidural   Post-op Pain Management: Minimal or no pain anticipated   Induction: Intravenous  PONV Risk Score and Plan: 2 and Treatment may vary due to age or medical condition  Airway Management Planned: Natural Airway  Additional Equipment: None  Intra-op Plan:   Post-operative Plan:   Informed Consent: I have reviewed the patients History and Physical, chart, labs and discussed the procedure including the risks, benefits and alternatives for the proposed anesthesia with the patient or authorized representative who has indicated his/her understanding and acceptance.       Plan Discussed with: Anesthesiologist  Anesthesia Plan Comments:         Anesthesia Quick Evaluation

## 2022-02-04 LAB — CBC
HCT: 23.4 % — ABNORMAL LOW (ref 36.0–46.0)
Hemoglobin: 7.4 g/dL — ABNORMAL LOW (ref 12.0–15.0)
MCH: 25.4 pg — ABNORMAL LOW (ref 26.0–34.0)
MCHC: 31.6 g/dL (ref 30.0–36.0)
MCV: 80.4 fL (ref 80.0–100.0)
Platelets: 122 10*3/uL — ABNORMAL LOW (ref 150–400)
RBC: 2.91 MIL/uL — ABNORMAL LOW (ref 3.87–5.11)
RDW: 16.7 % — ABNORMAL HIGH (ref 11.5–15.5)
WBC: 10.9 10*3/uL — ABNORMAL HIGH (ref 4.0–10.5)
nRBC: 0 % (ref 0.0–0.2)

## 2022-02-04 LAB — RPR: RPR Ser Ql: NONREACTIVE

## 2022-02-04 MED ORDER — IBUPROFEN 600 MG PO TABS: 600.0000 mg | ORAL_TABLET | Freq: Four times a day (QID) | ORAL | 0 refills | Status: AC | PRN

## 2022-02-04 NOTE — Anesthesia Postprocedure Evaluation (Signed)
Anesthesia Post Note  Patient: Cytogeneticist  Procedure(s) Performed: AN AD HOC LABOR EPIDURAL     Patient location during evaluation: Mother Baby Anesthesia Type: Epidural Level of consciousness: awake and alert Pain management: pain level controlled Vital Signs Assessment: post-procedure vital signs reviewed and stable Respiratory status: spontaneous breathing, nonlabored ventilation and respiratory function stable Cardiovascular status: stable Postop Assessment: no headache, no backache and epidural receding Anesthetic complications: no   No notable events documented.  Last Vitals:  Vitals:   02/04/22 0057 02/04/22 0509  BP: 117/80 117/76  Pulse: 84 75  Resp: 18 18  Temp: 37 C 36.8 C  SpO2:      Last Pain:  Vitals:   02/04/22 0509  TempSrc: Oral  PainSc:    Pain Goal:                   Aljean Horiuchi

## 2022-02-04 NOTE — Progress Notes (Signed)
Post Partum Day 1 Subjective: no complaints, up ad lib, voiding, tolerating PO, and + flatus Ambulating without dizziness Objective: Blood pressure 117/76, pulse 75, temperature 98.3 F (36.8 C), temperature source Oral, resp. rate 18, height 5\' 7"  (1.702 m), weight 82 kg, last menstrual period 02/04/2021, SpO2 100 %, unknown if currently breastfeeding.  Physical Exam:  General: alert, cooperative, and no distress Lochia: appropriate Uterine Fundus: firm Incision: healing well DVT Evaluation: No evidence of DVT seen on physical exam.  Recent Labs    02/03/22 0841 02/04/22 0419  HGB 9.5* 7.4*  HCT 30.5* 23.4*    Assessment/Plan: Plan for discharge tomorrow D/W circumcision of newborn boy baby when OK'd by peds. D/W risks. She states she understands and agrees.  LOS: 1 day   [place], MD 02/04/2022, 7:16 AM

## 2022-02-04 NOTE — Discharge Instructions (Signed)
Call office for a post partum appointment in 6 weeks

## 2022-02-04 NOTE — Discharge Summary (Signed)
Postpartum Discharge Summary  Date of Service updated 02/04/22     Patient Name: Latoya Martin DOB: 1990-07-12 MRN: [id number]  Date of admission: 02/03/2022 Delivery date:02/03/2022  Delivering provider: Tyson Dense  Date of discharge: 02/04/2022  Admitting diagnosis: Pregnancy [Z34.90] Intrauterine pregnancy: [redacted]w[redacted]d    Secondary diagnosis:  Principal Problem:   Pregnancy  Additional problems:     Discharge diagnosis: Term Pregnancy Delivered                                              Post partum procedures: Augmentation: AROM, Pitocin, and Cytotec Complications: None  Hospital course: Induction of Labor With Vaginal Delivery   31y.o. yo [id number] admitted to the hospital 02/03/2022 for induction of labor.  Indication for induction: Favorable cervix at term.  Patient had an labor course complicated by Membrane Rupture Time/Date: 9:17 AM ,02/03/2022   Delivery Method:Vaginal, Spontaneous  Episiotomy: None  Lacerations:    Details of delivery can be found in separate delivery note.  Patient had a postpartum course complicated by. Patient is discharged home 02/04/22.  Newborn Data: Birth date:02/03/2022  Birth time:5:12 PM  Gender:Female  Living status:Living  Apgars:8 ,9  Weight:4470 g   Magnesium Sulfate received: No BMZ received: No Rhophylac:No MMR:No T-DaP:Given prenatally Flu: No Transfusion:No  Physical exam  Vitals:   02/03/22 2105 02/04/22 0057 02/04/22 0509 02/04/22 1215  BP: (!) 131/55 117/80 117/76 113/77  Pulse:  84 75 88  Resp:  _0 Temp:  98.6 F (37 C) 98.3 F (36.8 C) 98.1 F (36.7 C)  TempSrc:  Oral Oral Oral  SpO2:    100%  Weight:      Height:       General: alert, cooperative, and no distress Lochia: appropriate Uterine Fundus: firm Incision: Healing well with no significant drainage DVT Evaluation: No evidence of DVT seen on physical exam. Labs: Lab Results  Component Value Date   WBC 10.9  (H) 02/04/2022   HGB 7.4 (L) 02/04/2022   HCT 23.4 (L) 02/04/2022   MCV 80.4 02/04/2022   PLT 122 (L) 02/04/2022      Latest Ref Rng & Units 09/12/2015    3:25 PM  CMP  Glucose 65 - 99 mg/dL 107   BUN 6 - 20 mg/dL 8   Creatinine 0.44 - 1.00 mg/dL 0.49   Sodium 135 - 145 mmol/L 135   Potassium 3.5 - 5.1 mmol/L 3.7   Chloride 101 - 111 mmol/L 108   CO2 22 - 32 mmol/L 20   Calcium 8.9 - 10.3 mg/dL 8.2   Total Protein 6.5 - 8.1 g/dL 6.4   Total Bilirubin 0.3 - 1.2 mg/dL 0.5   Alkaline Phos 38 - 126 U/L 107   AST 15 - 41 U/L 19   ALT 14 - 54 U/L 17    Edinburgh Score:    02/04/2022   11:25 AM  Edinburgh Postnatal Depression Scale Screening Tool  I have been able to laugh and see the funny side of things. 0  I have looked forward with enjoyment to things. 0  I have blamed myself unnecessarily when things went wrong. 2  I have been anxious or worried for no good reason. 2  I have felt scared or panicky for no good reason. 2  Things have been getting on  top of me. 0  I have been so unhappy that I have had difficulty sleeping. 1  I have felt sad or miserable. 0  I have been so unhappy that I have been crying. 0  The thought of harming myself has occurred to me. 0  Edinburgh Postnatal Depression Scale Total 7      After visit meds:  Allergies as of 02/04/2022   No Known Allergies      Medication List     STOP taking these medications    cyclobenzaprine 10 MG tablet Commonly known as: FLEXERIL       TAKE these medications    acetaminophen 500 MG tablet Commonly known as: TYLENOL Take 1,000 mg by mouth every 6 (six) hours as needed.   ferrous sulfate 325 (65 FE) MG tablet Take 325 mg by mouth daily with breakfast.   ibuprofen 600 MG tablet Commonly known as: ADVIL Take 1 tablet (600 mg total) by mouth every 6 (six) hours as needed.   multivitamin-prenatal 27-0.8 MG Tabs tablet Take 1 tablet by mouth daily at 12 noon.         Discharge home in stable  condition Infant Feeding: Breast Infant Disposition:home with mother Discharge instruction: per After Visit Summary and Postpartum booklet. Activity: Advance as tolerated. Pelvic rest for 6 weeks.  Diet: routine diet Anticipated Birth Control: Unsure Postpartum Appointment:6 weeks Additional Postpartum F/U:  Future Appointments:No future appointments. Follow up Visit:      02/04/2022 Allena Katz, MD

## 2022-02-04 NOTE — Lactation Note (Signed)
This note was copied from a baby's chart. Lactation Consultation Note  Patient Name: Latoya Martin [id number] Date: 02/04/2022 Reason for consult: Initial assessment;Term;Infant weight loss;Breastfeeding assistance (3.02% WL) Age:32 hours  LC entered the room and the infant was in the bassinet.  Per the birth parent, the infant has been doing well at the breast, but she is unsure if he is getting enough.  LC spoke with the birth parent about milk production in the early days of life, supply, and demand, and positioning.  LC assisted the birth parent with latching the infant to the left breast in the cross-cradle position.  The infant latched with his tongue down, lips were flanged, sucking was rhythmic, and swallows were noted.  The infant fed for about 5 min before falling asleep at the breast.  The birth parent stated that she had issues with supply with her first baby.  She stated that she was only able to fill the bottle half way up when her infant was 21.26 weeks old.  LC spoke with her about milk production and how much the infant should take in up to 27 month old.  LC showed the birth parent how to hand express and drops were noted.  All questions were answered.   Current Feeding Plan:  Breastfeed 8+ times in 24 hours according to feeding cues.  Call [facility] for assistance with breastfeeding.   Maternal Data Has patient been taught Hand Expression?: Yes Does the patient have breastfeeding experience prior to this delivery?: Yes How long did the patient breastfeed?: 3 Weeks  Feeding Mother's Current Feeding Choice: Breast Milk  LATCH Score Latch: Grasps breast easily, tongue down, lips flanged, rhythmical sucking.  Audible Swallowing: Spontaneous and intermittent  Type of Nipple: Everted at rest and after stimulation  Comfort (Breast/Nipple): Soft / non-tender  Hold (Positioning): Assistance needed to correctly position infant at breast and maintain latch.  LATCH Score:  9   Lactation Tools Discussed/Used    Interventions Interventions: Breast feeding basics reviewed;Assisted with latch;Breast massage;Hand express;Adjust position;Education;LC Services brochure  Discharge Pump: DEBP;Personal (Medela)  Consult Status Consult Status: Follow-up Date: 02/05/22 Follow-up type: In-patient    Latoya Martin 02/04/2022, 3:15 PM

## 2022-02-04 NOTE — Lactation Note (Signed)
This note was copied from a baby's chart. Lactation Consultation Note  Patient Name: Latoya Martin [id number] Date: 02/04/2022   Age:32 hours  LC attempted to visit with the birth parent, but the photographer was in the room. Will follow up later.   Maternal Data    Feeding    LATCH Score                    Lactation Tools Discussed/Used    Interventions    Discharge    Consult Status      [name] 02/04/2022, 12:20 PM

## 2022-02-13 ENCOUNTER — Telehealth (HOSPITAL_COMMUNITY): Payer: Self-pay | Admitting: *Deleted

## 2022-02-13 NOTE — Telephone Encounter (Signed)
Left phone voicemail message.  Duffy Rhody, RN 02-13-2022 at 3:22pm

## 2022-03-29 IMAGING — US US BREAST*R* LIMITED INC AXILLA
1 series · 9 of 9 positions shown · non-contrast
Comparison: None.

CLINICAL DATA: Tender mass felt by the patient the inferior right
breast for the past month. No family history of breast cancer.

EXAM:
DIGITAL DIAGNOSTIC BILATERAL MAMMOGRAM WITH CAD AND TOMO
ULTRASOUND RIGHT BREAST

[Series 1: us breast*right* limited inc axilla · 0.06mm/px · 9 of 9 slices shown]
[im 1/9]
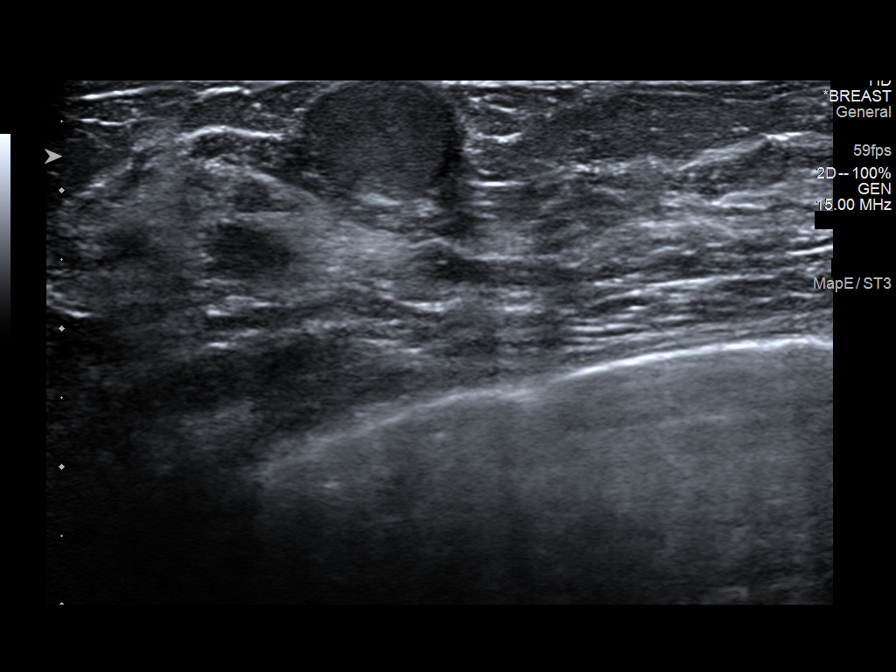
[im 2/9]
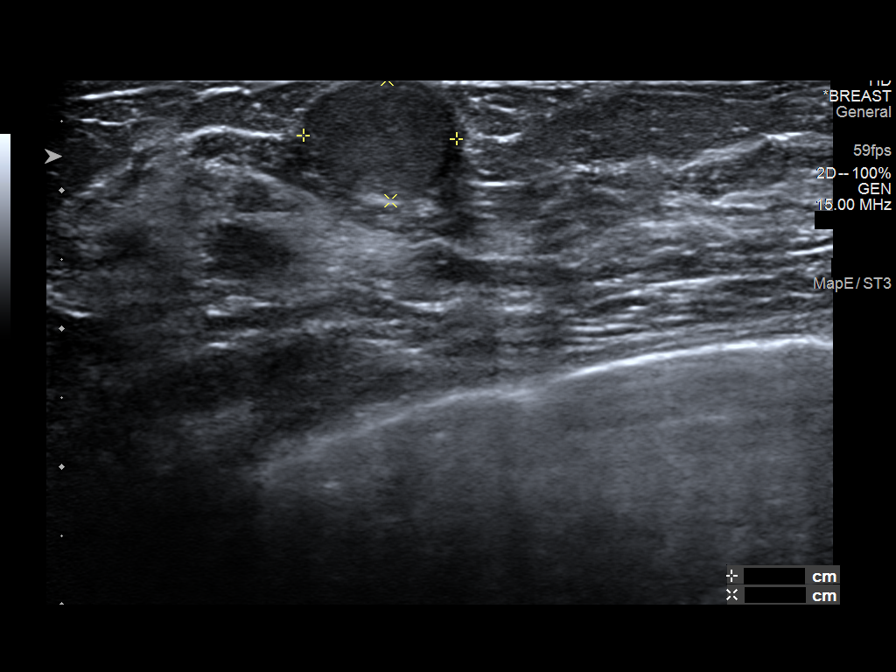
[im 3/9]
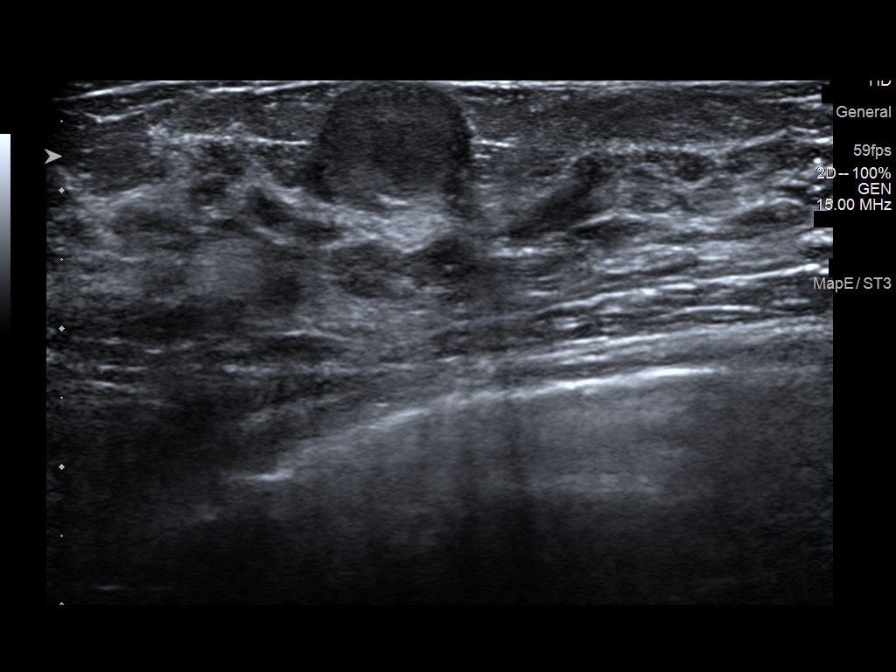
[im 4/9]
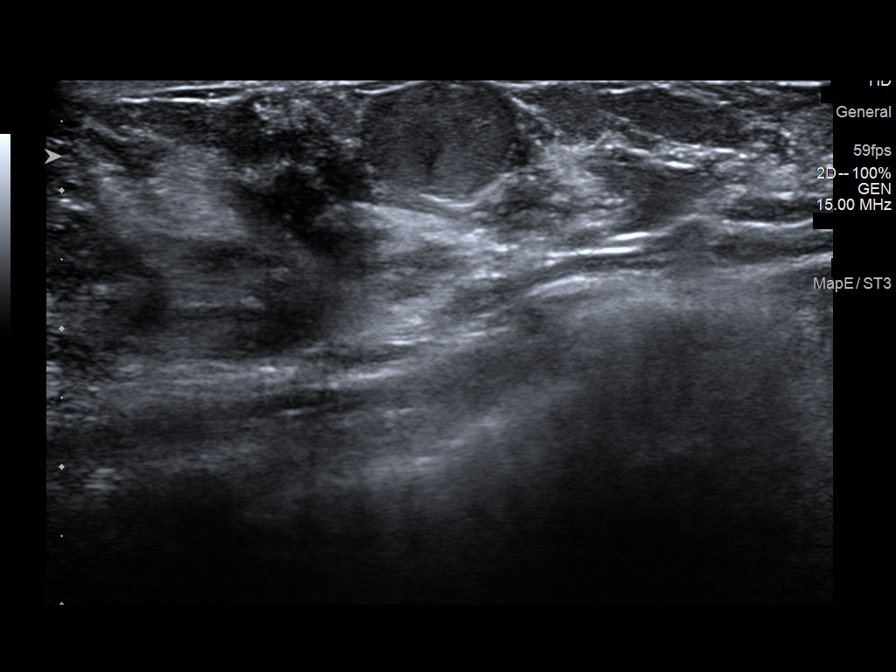
[im 5/9]
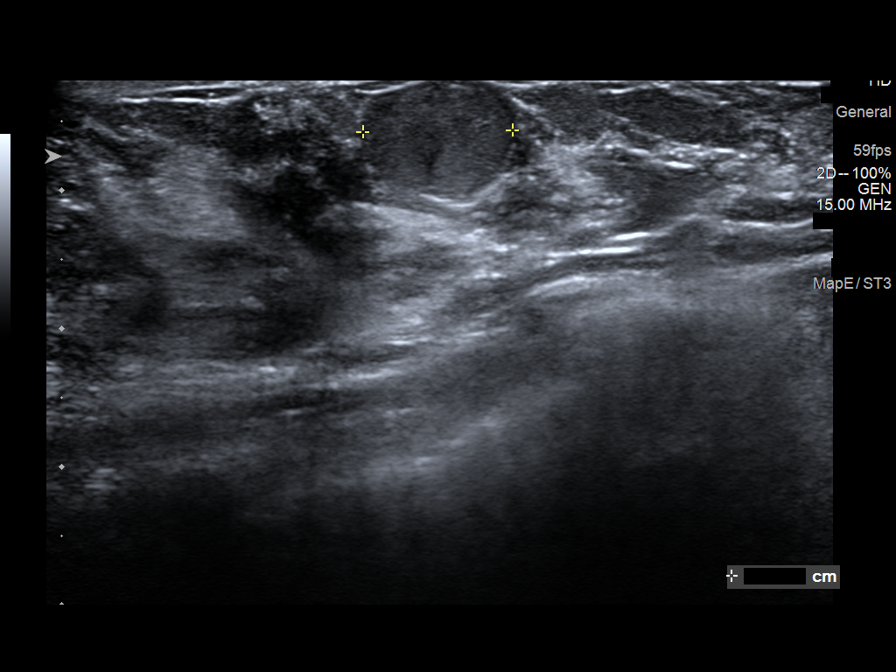
[im 6/9]
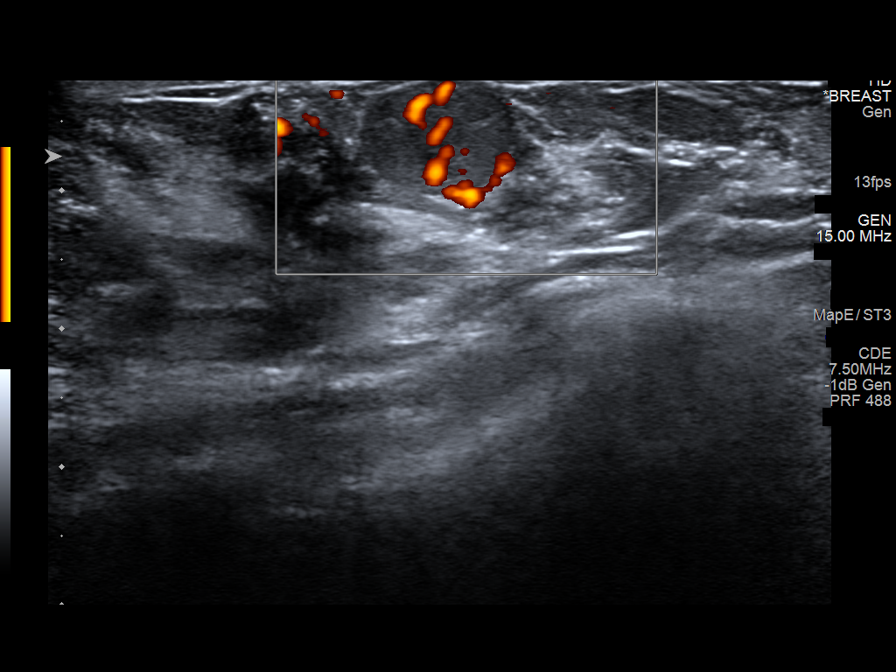
[im 7/9]
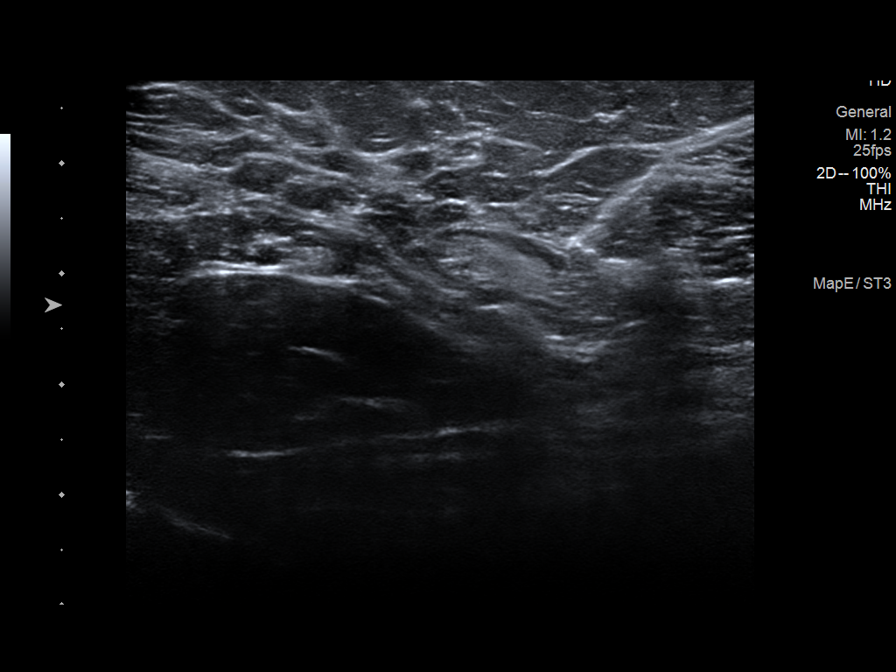
[im 8/9]
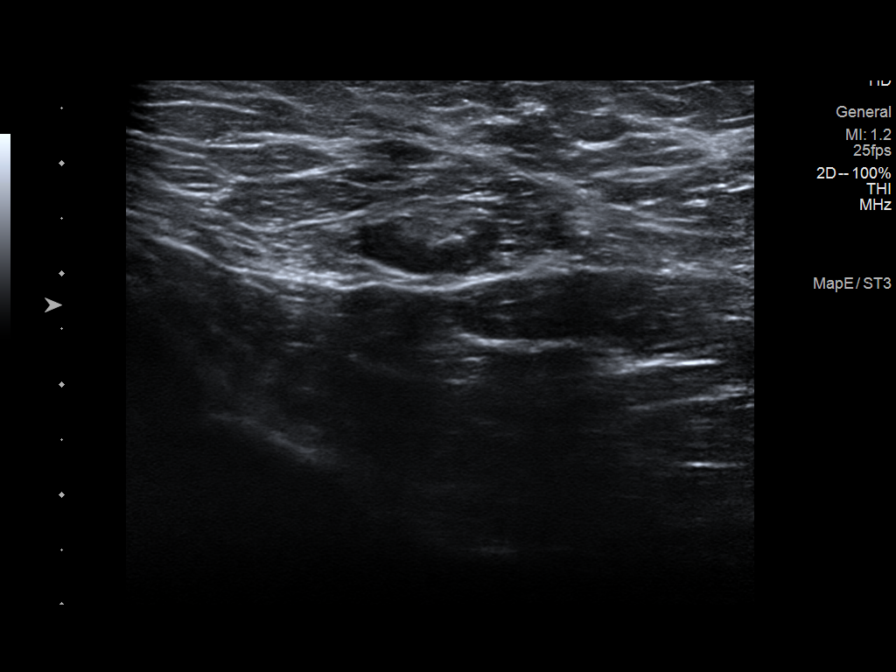
[im 9/9]
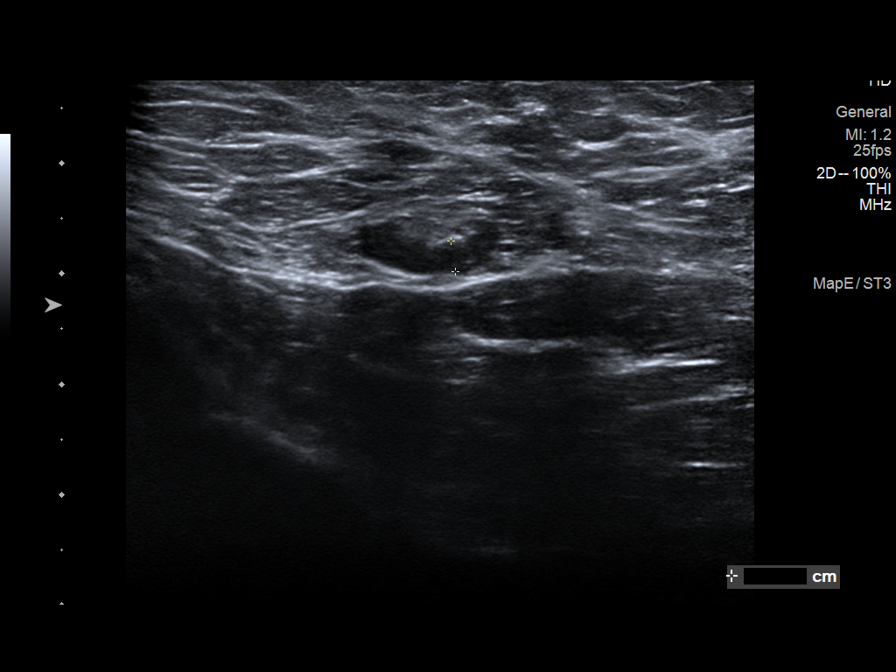

[9 of 9 positions shown; findings below may reference images not displayed]

ACR Breast Density Category d: The breast tissue is extremely dense,
which lowers the sensitivity of mammography.
FINDINGS: 3D tomographic and 2D generated mammographic images of the both
breasts were obtained. These demonstrate an oval, circumscribed mass
just beneath the skin at the location of the mass felt by the
patient, marked with a metallic marker. No findings suspicious for
malignancy elsewhere in either breast.

Mammographic images were processed with CAD.

On physical exam, the patient has an approximately 10 x 6 mm oval,
circumscribed, mobile, palpable mass in the 6-7 o'clock position of
the right breast, 2-3 cm from the nipple. The patient reports that
this is mildly tender to palpation today.

Targeted ultrasound is performed, showing a 1.1 x 1.1 x 0.9 cm oval,
horizontally oriented, circumscribed, medium echotexture mass just
beneath the skin in the 6-7 o'clock position of the right breast,
2-3 cm from the nipple. This has internal blood flow with power
Doppler.

Ultrasound of the right axilla demonstrated normal appearing right
axillary lymph nodes.
IMPRESSION: 1.1 cm probable benign fibroadenoma in the 6-7 o'clock position of
the right breast.

RECOMMENDATION:
Right breast ultrasound in 6 months. The option of ultrasound-guided
core needle biopsy was also discussed with the patient but not
recommended at this time. She is currently comfortable with the 6
month followup ultrasound. The patient was instructed to return
sooner if the area that she feels becomes larger to palpation during
that time.

I have discussed the findings and recommendations with the patient.
If applicable, a reminder letter will be sent to the patient
regarding the next appointment.

BI-RADS CATEGORY  3: Probably benign.

## 2022-03-29 IMAGING — MG DIGITAL DIAGNOSTIC BILAT W/ TOMO W/ CAD
6 of 10 series · 6 of 30 positions shown · non-contrast
Comparison: None.

CLINICAL DATA: Tender mass felt by the patient the inferior right
breast for the past month. No family history of breast cancer.

EXAM:
DIGITAL DIAGNOSTIC BILATERAL MAMMOGRAM WITH CAD AND TOMO
ULTRASOUND RIGHT BREAST

[L CC synth-2D]
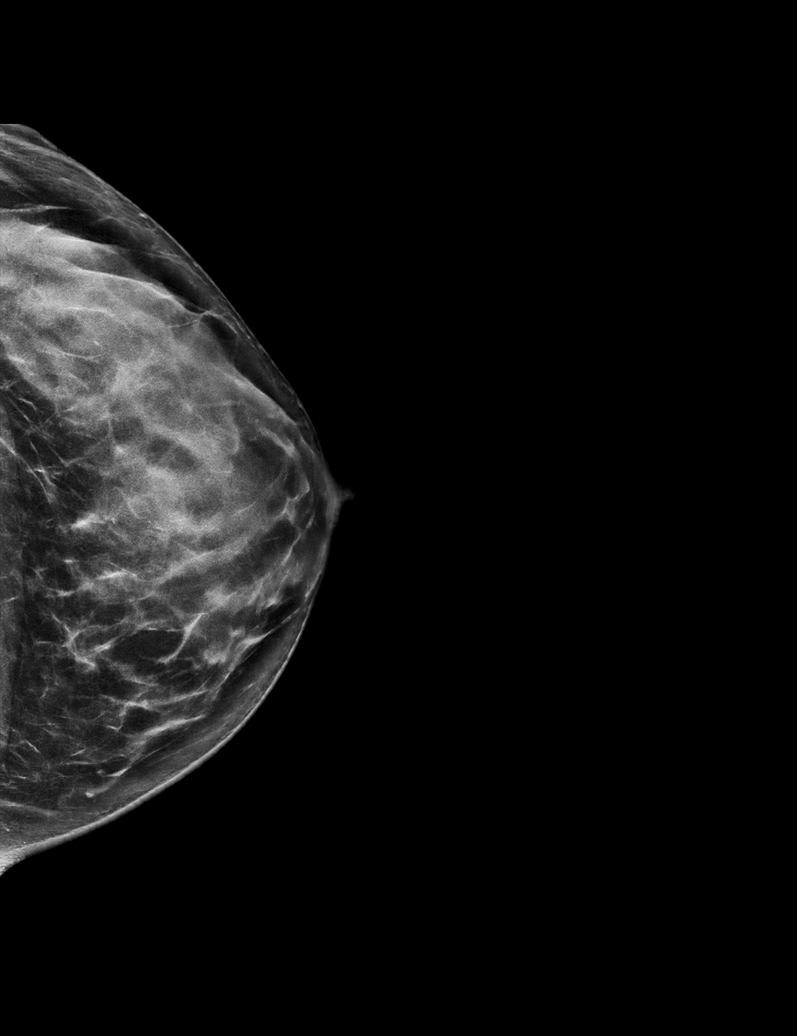

[R ML synth-2D]
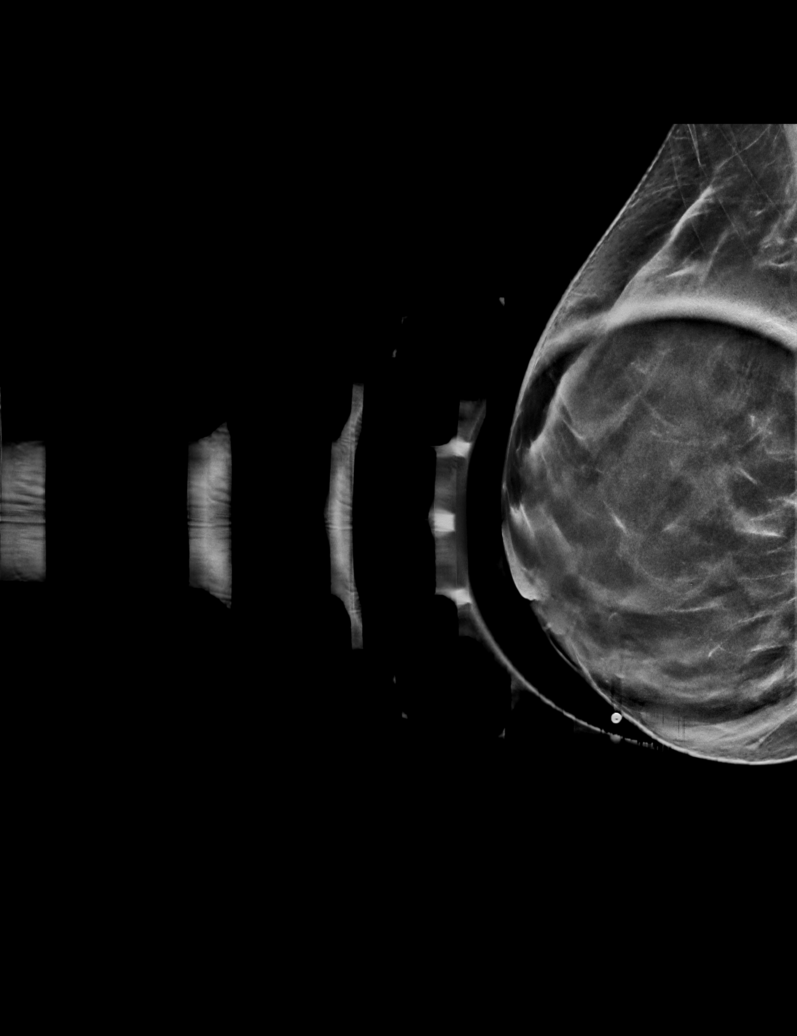

[R CC synth-2D]
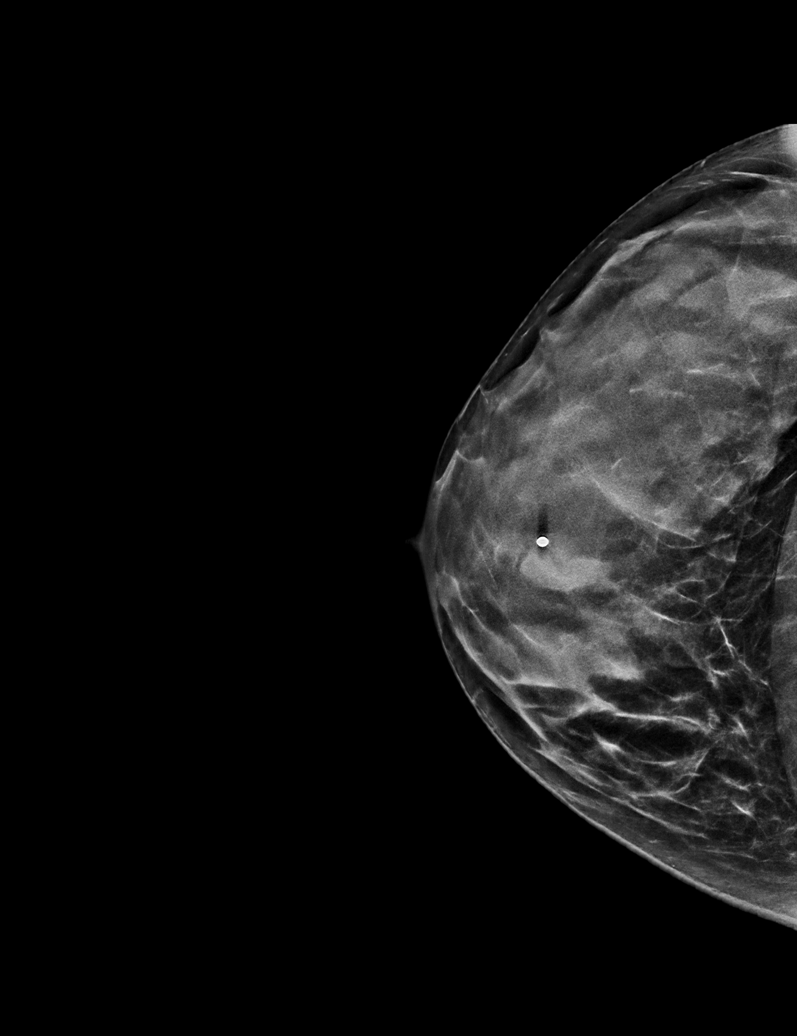

[L MLO synth-2D]
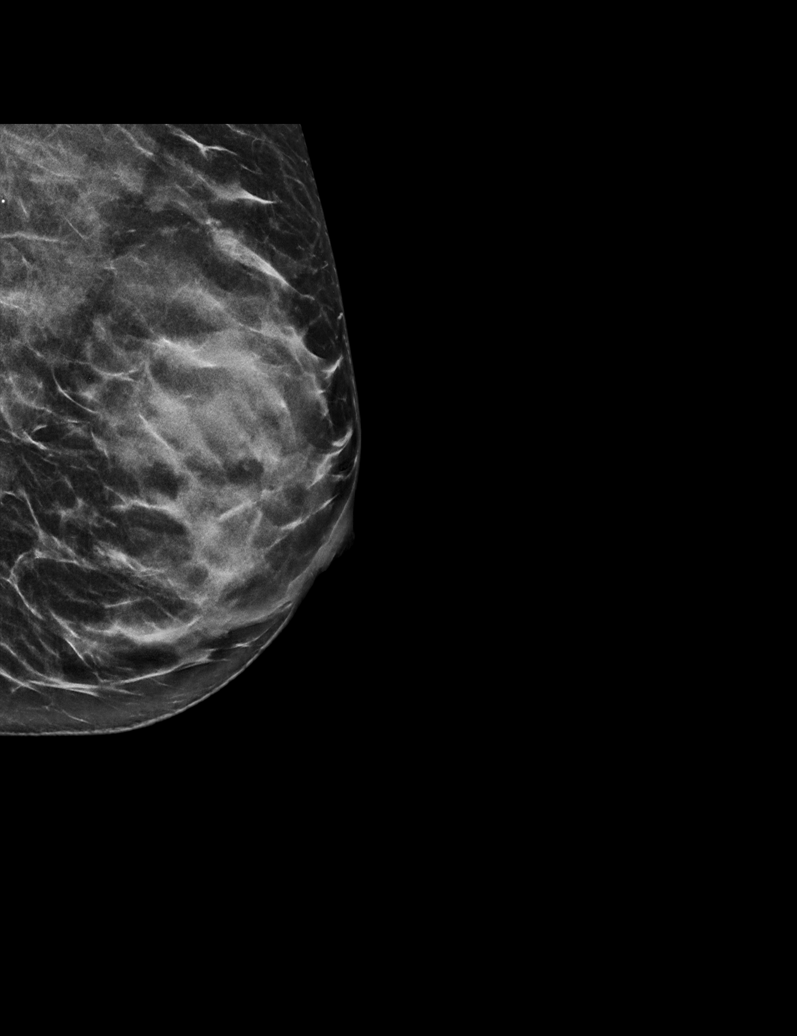

[R MLO synth-2D]
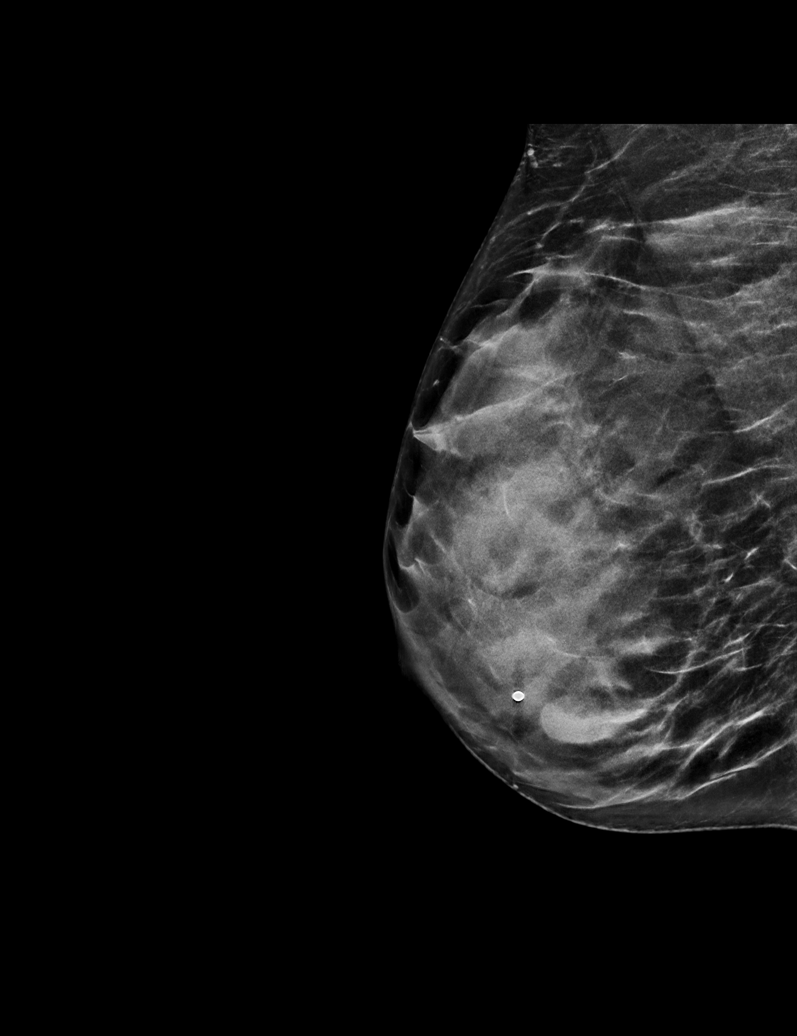

[R CC tomo · tomo slice 28/55.0]
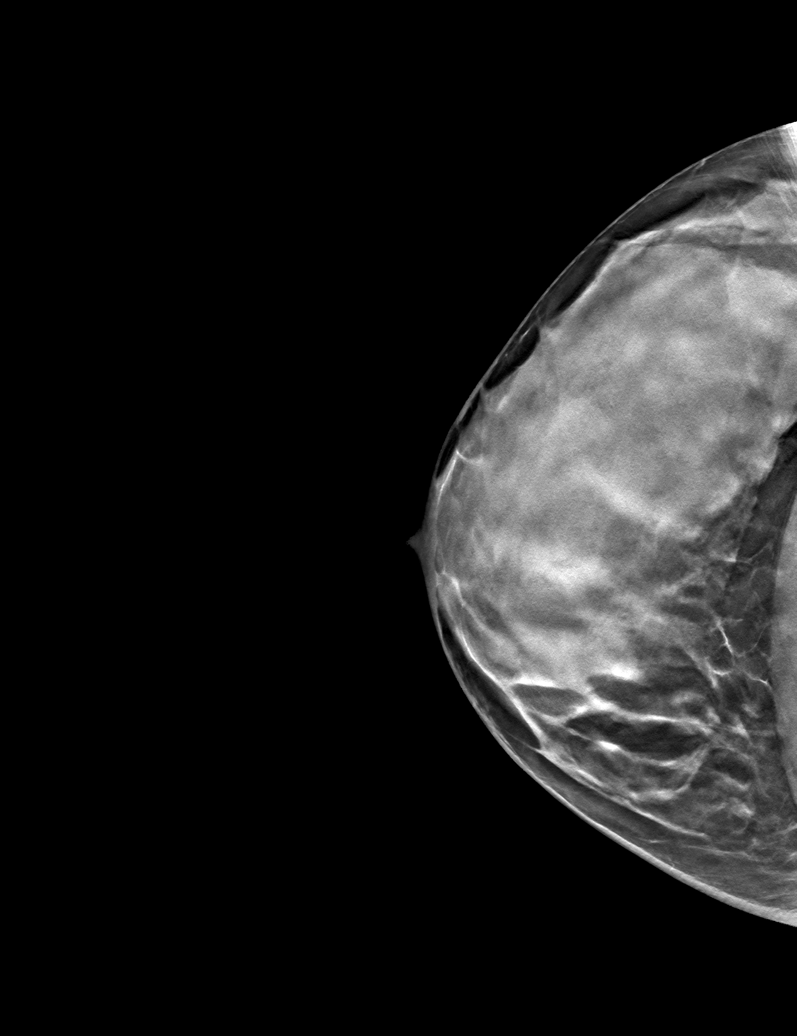

[6 of 30 positions shown; findings below may reference images not displayed]

ACR Breast Density Category d: The breast tissue is extremely dense,
which lowers the sensitivity of mammography.
FINDINGS: 3D tomographic and 2D generated mammographic images of the both
breasts were obtained. These demonstrate an oval, circumscribed mass
just beneath the skin at the location of the mass felt by the
patient, marked with a metallic marker. No findings suspicious for
malignancy elsewhere in either breast.

Mammographic images were processed with CAD.

On physical exam, the patient has an approximately 10 x 6 mm oval,
circumscribed, mobile, palpable mass in the 6-7 o'clock position of
the right breast, 2-3 cm from the nipple. The patient reports that
this is mildly tender to palpation today.

Targeted ultrasound is performed, showing a 1.1 x 1.1 x 0.9 cm oval,
horizontally oriented, circumscribed, medium echotexture mass just
beneath the skin in the 6-7 o'clock position of the right breast,
2-3 cm from the nipple. This has internal blood flow with power
Doppler.

Ultrasound of the right axilla demonstrated normal appearing right
axillary lymph nodes.
IMPRESSION: 1.1 cm probable benign fibroadenoma in the 6-7 o'clock position of
the right breast.

RECOMMENDATION:
Right breast ultrasound in 6 months. The option of ultrasound-guided
core needle biopsy was also discussed with the patient but not
recommended at this time. She is currently comfortable with the 6
month followup ultrasound. The patient was instructed to return
sooner if the area that she feels becomes larger to palpation during
that time.

I have discussed the findings and recommendations with the patient.
If applicable, a reminder letter will be sent to the patient
regarding the next appointment.

BI-RADS CATEGORY  3: Probably benign.

## 2023-11-20 IMAGING — US US EXTREM LOW VENOUS*L*
1 series · 14 of 24 positions shown · non-contrast
Comparison: None Available.

CLINICAL DATA: Left leg pain and swelling.

EXAM:
LEFT LOWER EXTREMITY VENOUS DOPPLER ULTRASOUND
TECHNIQUE: Gray-scale sonography with compression, as well as color and duplex
ultrasound, were performed to evaluate the deep venous system(s)
from the level of the common femoral vein through the popliteal and
proximal calf veins.

[Series 1: us extrem low venous*left* · 14 of 46 slices shown]
[im 1/46]
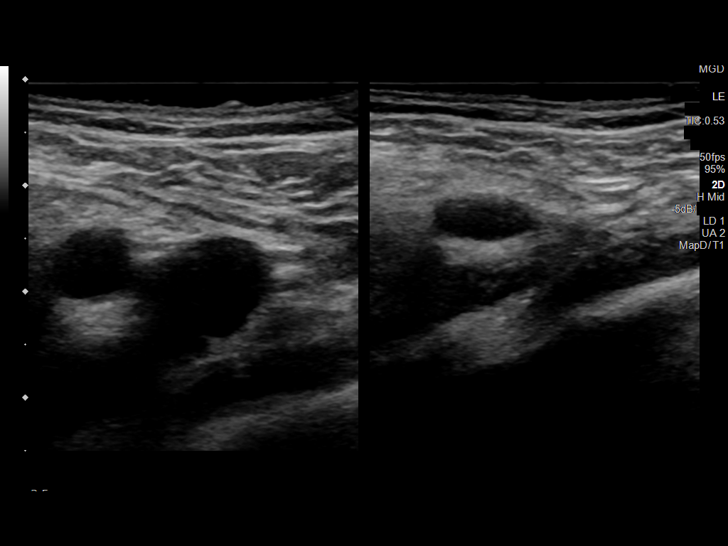
[im 4/46]
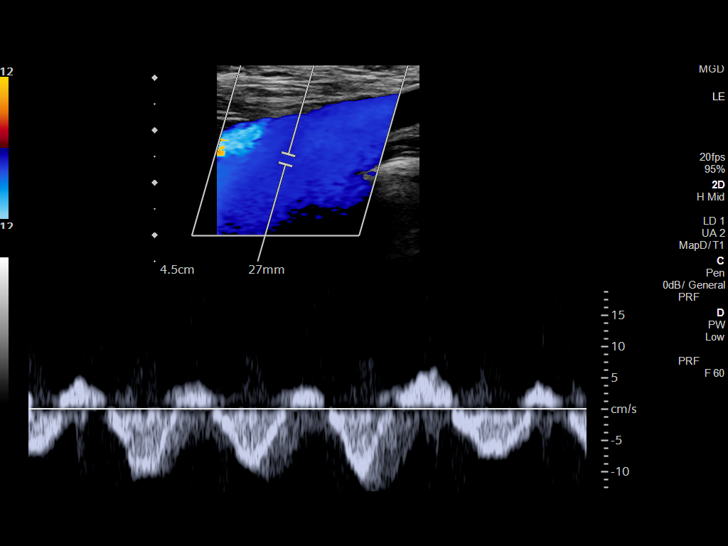
[im 8/46]
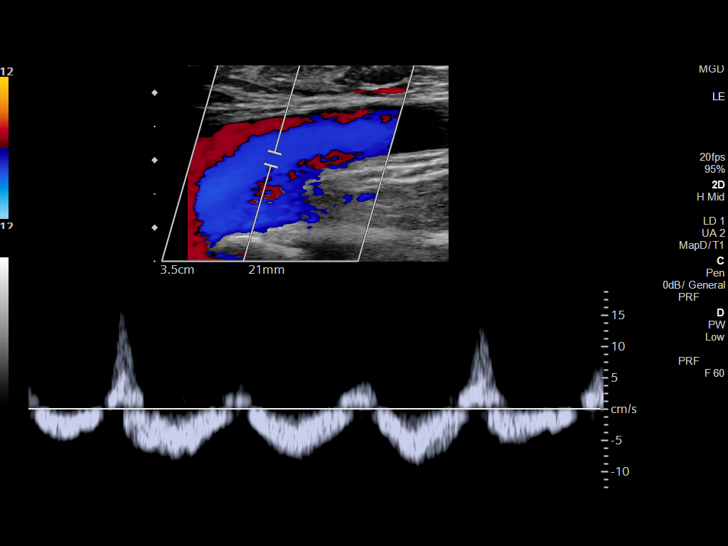
[im 12/46]
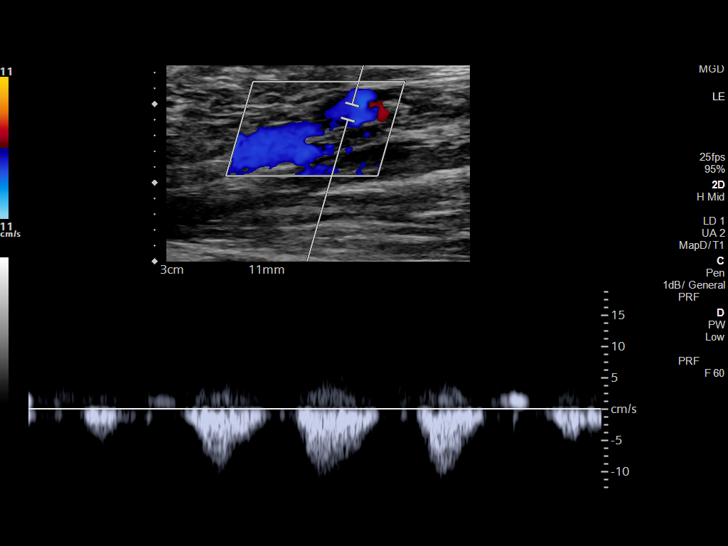
[im 14/46]
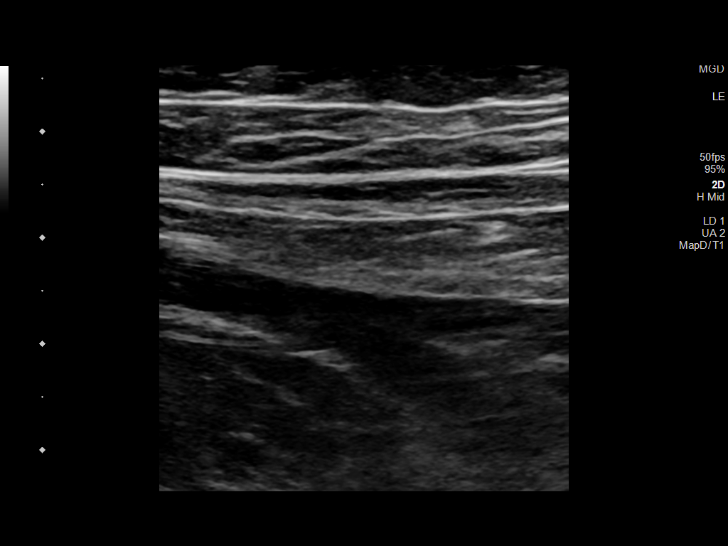
[im 18/46]
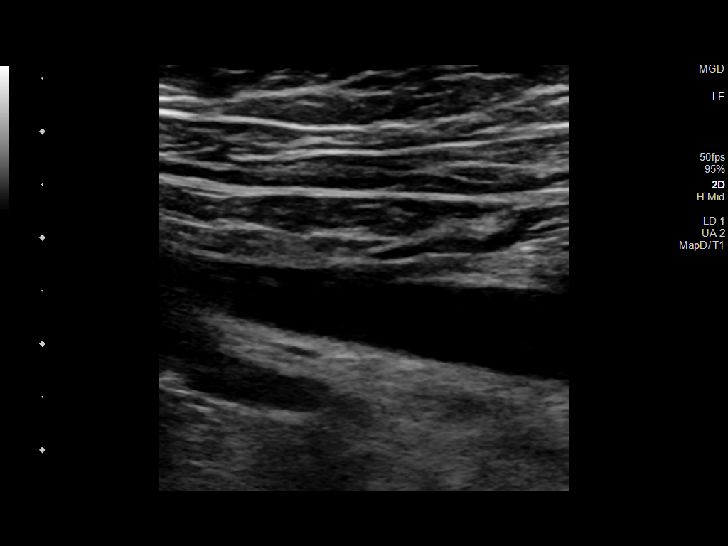
[im 22/46]
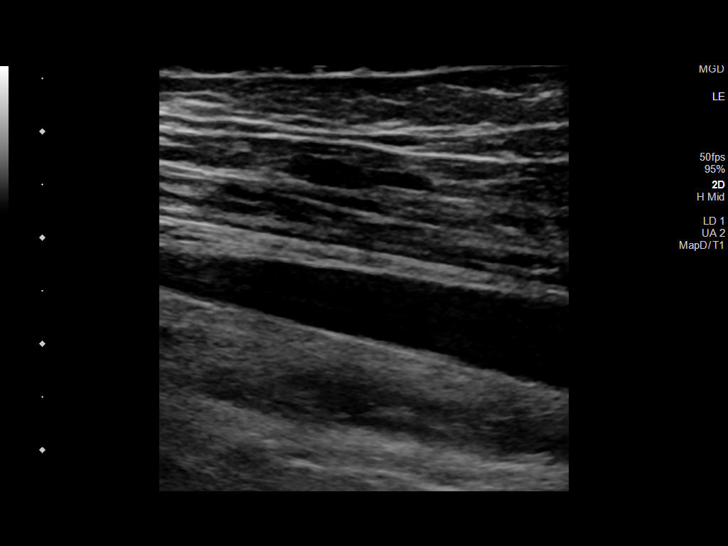
[im 24/46]
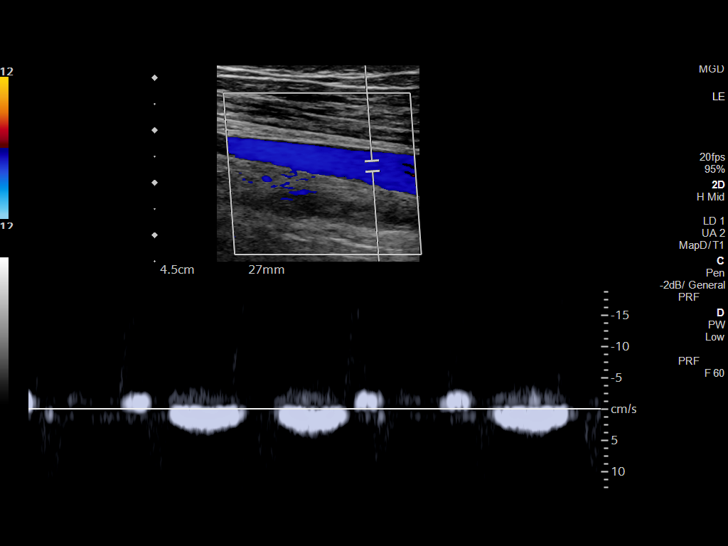
[im 28/46]
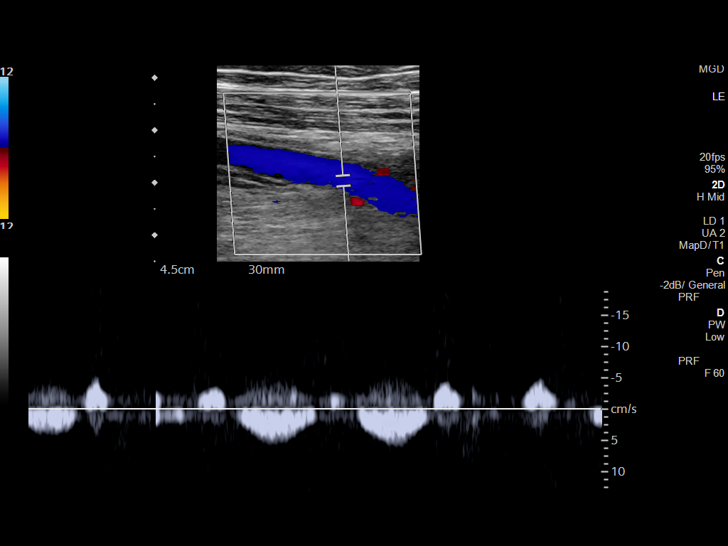
[im 32/46]
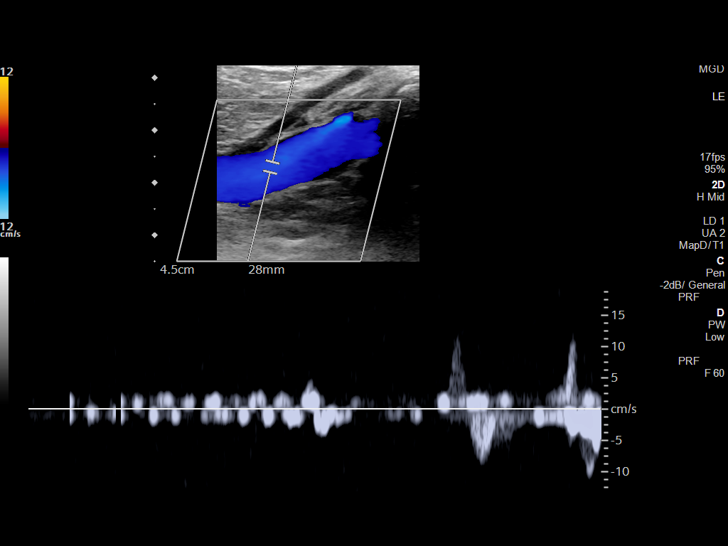
[im 36/46]
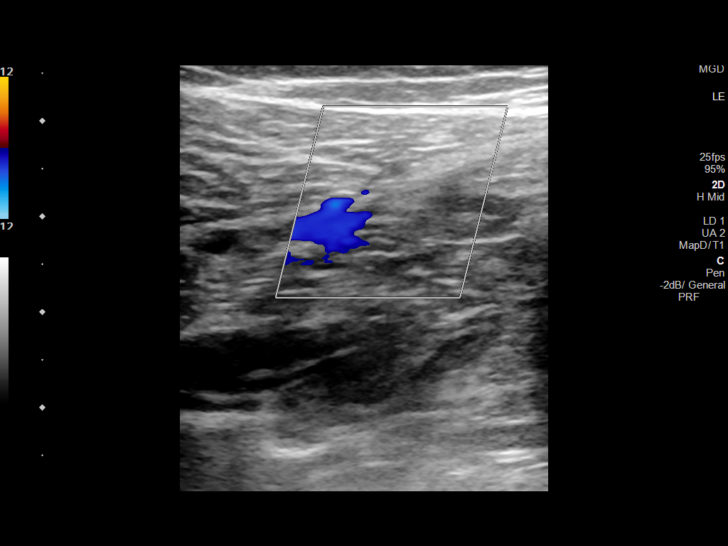
[im 38/46]
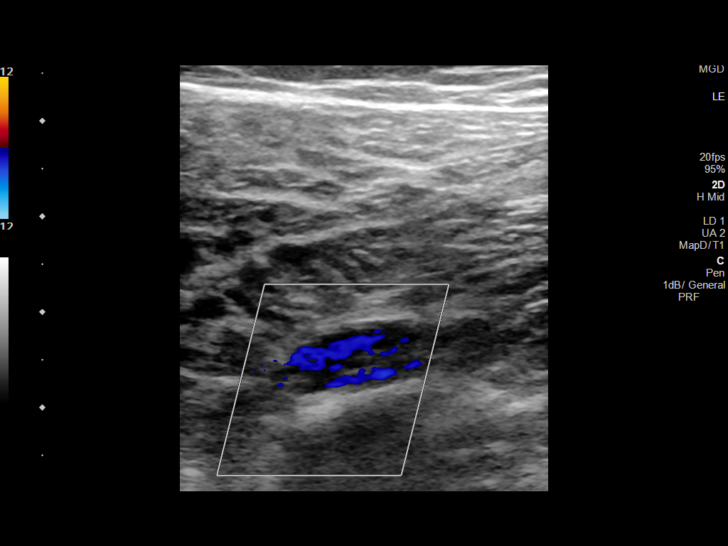
[im 42/46]
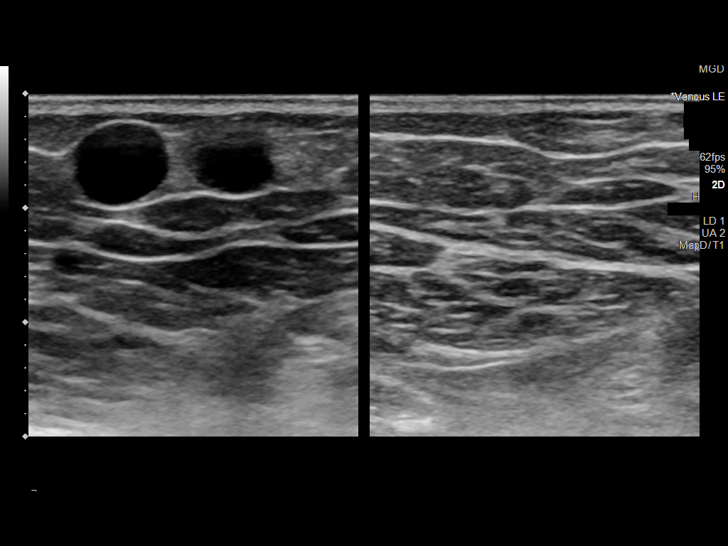
[im 46/46]
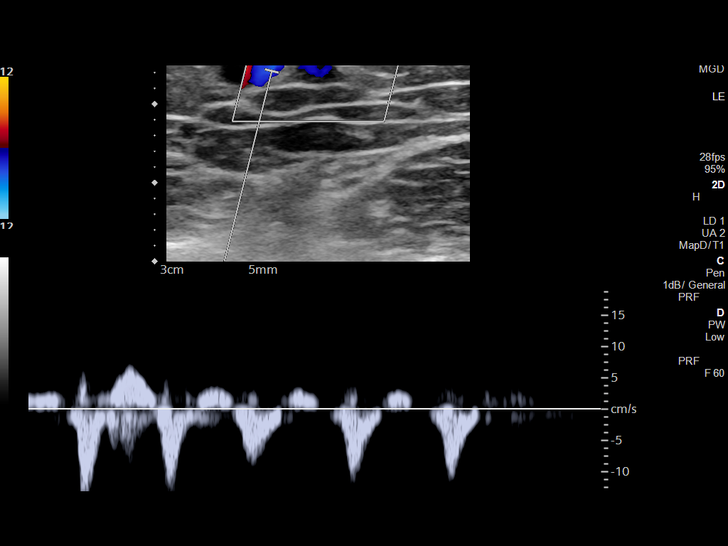

[14 of 24 positions shown; findings below may reference images not displayed]

FINDINGS: VENOUS

Normal compressibility of the common femoral, superficial femoral,
and popliteal veins, as well as the visualized calf veins.
Visualized portions of profunda femoral vein and great saphenous
vein unremarkable. No filling defects to suggest DVT on grayscale or
color Doppler imaging. Doppler waveforms show normal direction of
venous flow, normal respiratory plasticity and response to
augmentation.

Limited views of the contralateral common femoral vein are
unremarkable.

OTHER

Superficial varicosities from greater saphenous vein, thigh to knee.

Limitations: none
IMPRESSION: No evidence of a left lower extremity deep venous thrombosis.

## 2023-11-30 IMAGING — US US EXTREM LOW VENOUS*L*
1 series · 14 of 24 positions shown · non-contrast
Comparison: 09/01/2021

CLINICAL DATA: Pain, history of varicose veins

EXAM:
LEFT LOWER EXTREMITY VENOUS DOPPLER ULTRASOUND
TECHNIQUE: Gray-scale sonography with compression, as well as color and duplex
ultrasound, were performed to evaluate the deep venous system(s)
from the level of the common femoral vein through the popliteal and
proximal calf veins.

[Series 1: us extrem low venous*left* · 14 of 29 slices shown]
[im 1/29]
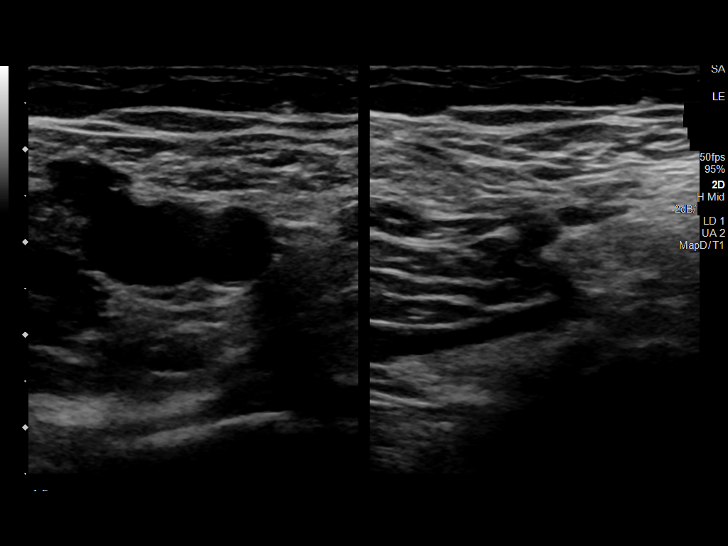
[im 3/29]
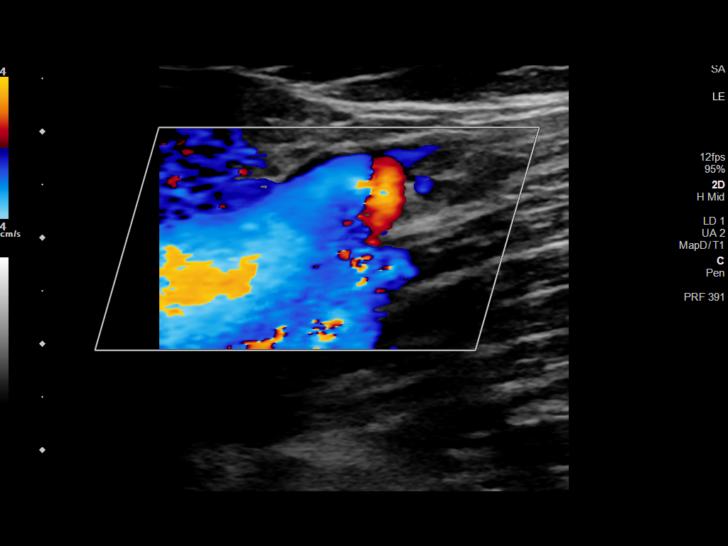
[im 5/29]
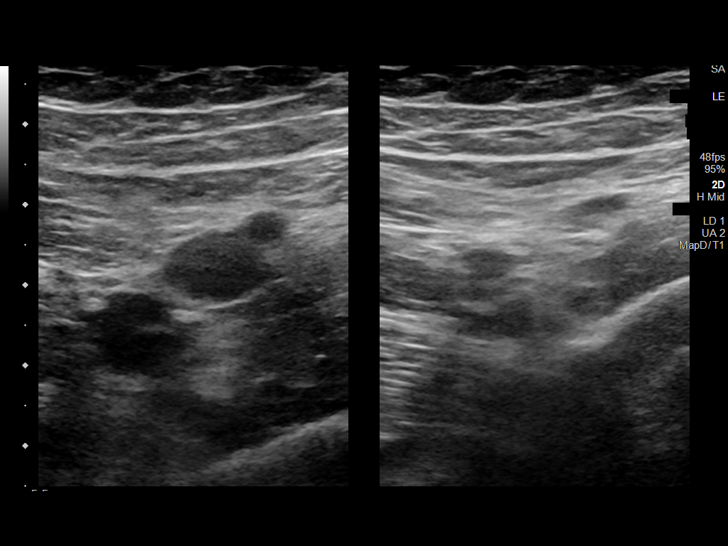
[im 8/29]
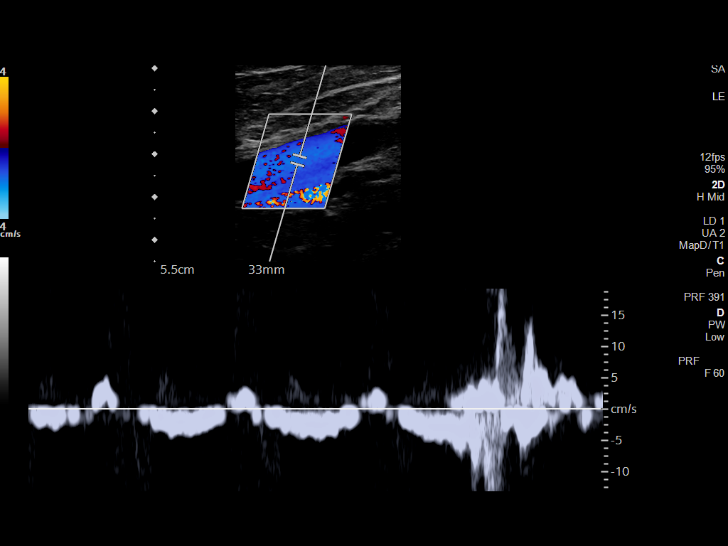
[im 9/29]
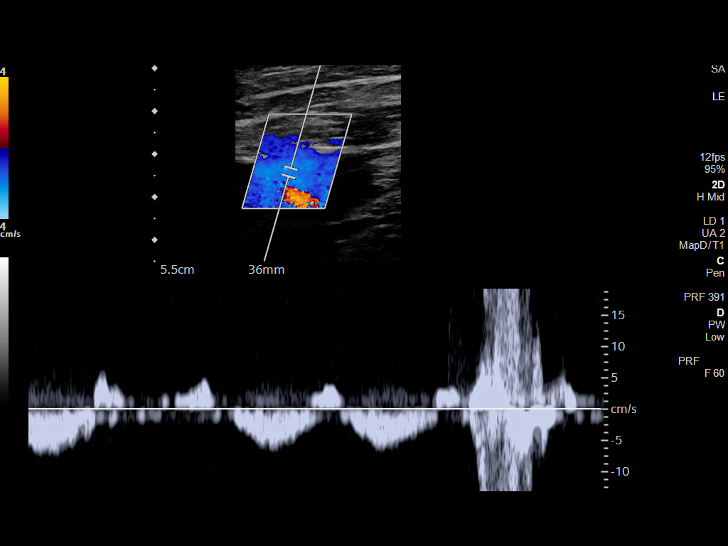
[im 11/29]
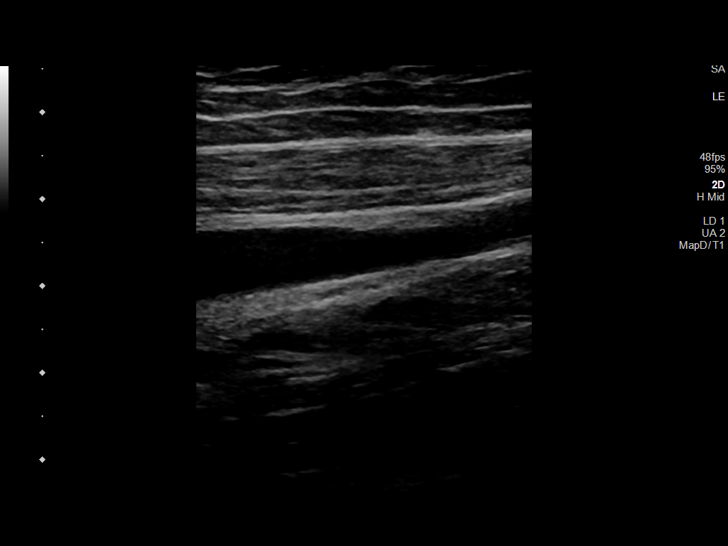
[im 14/29]
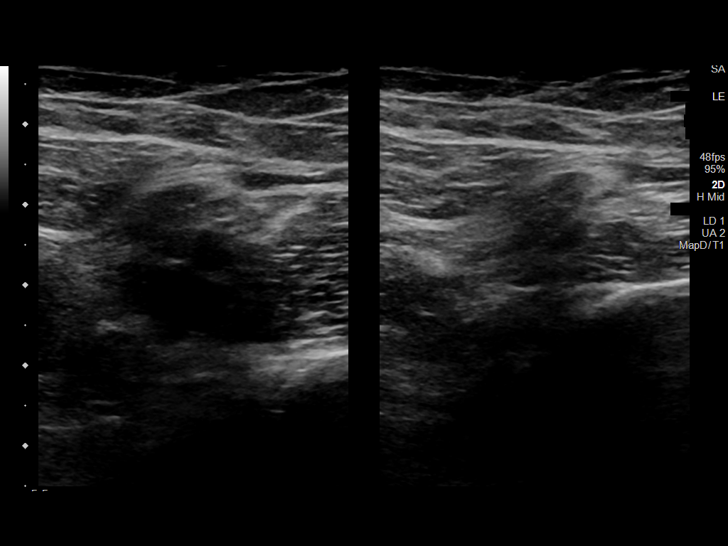
[im 15/29]
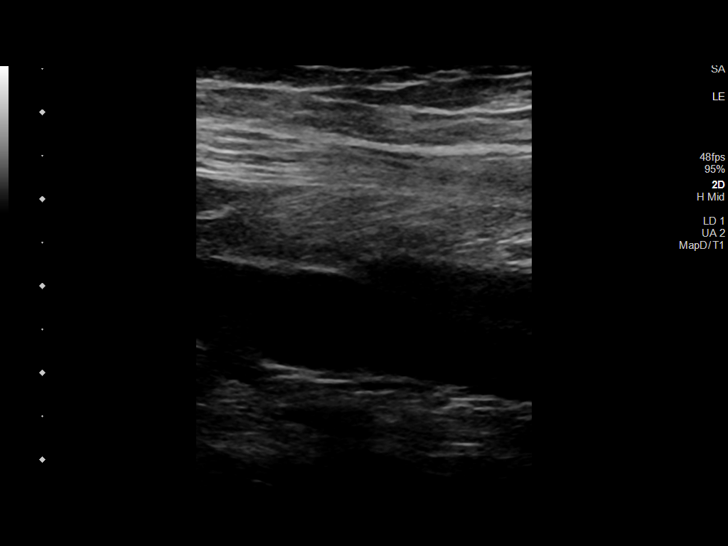
[im 18/29]
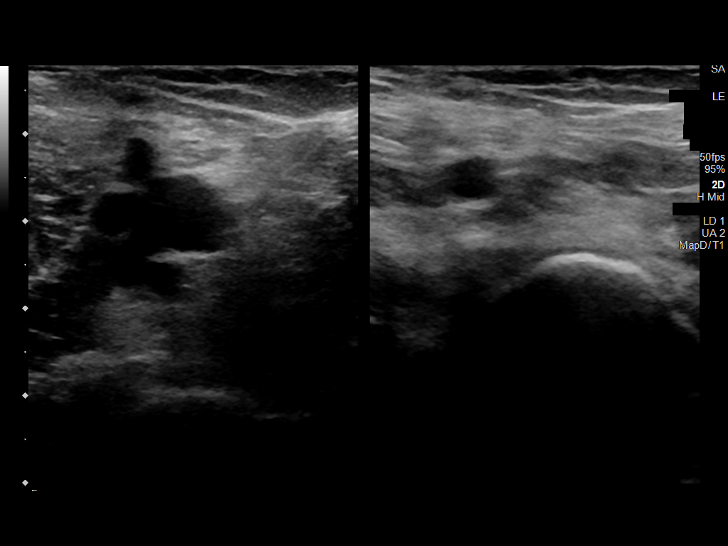
[im 20/29]
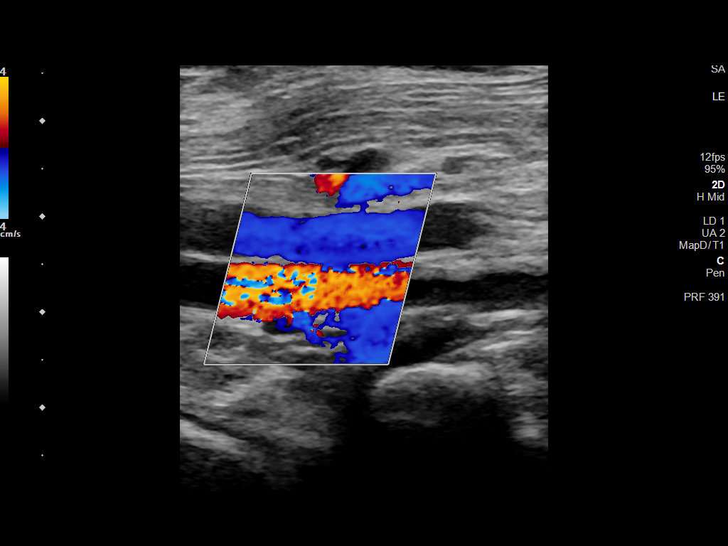
[im 22/29]
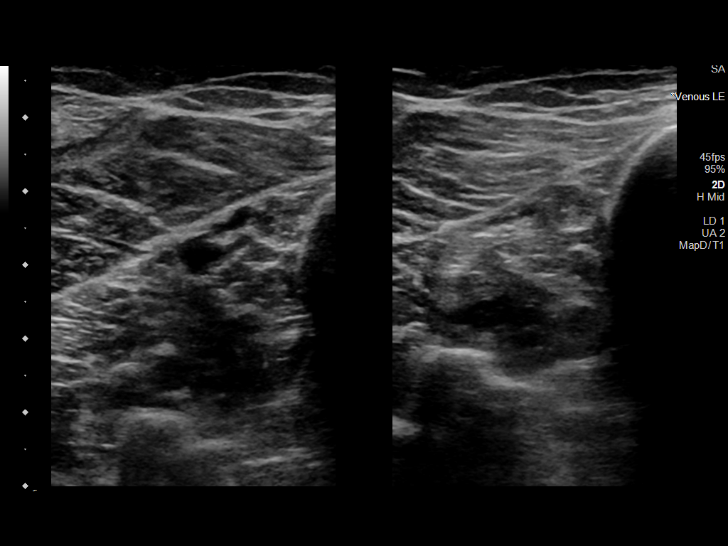
[im 24/29]
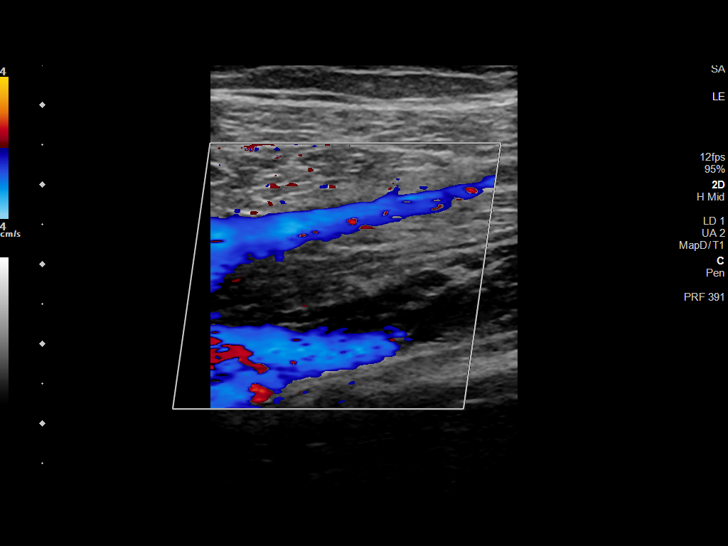
[im 26/29]
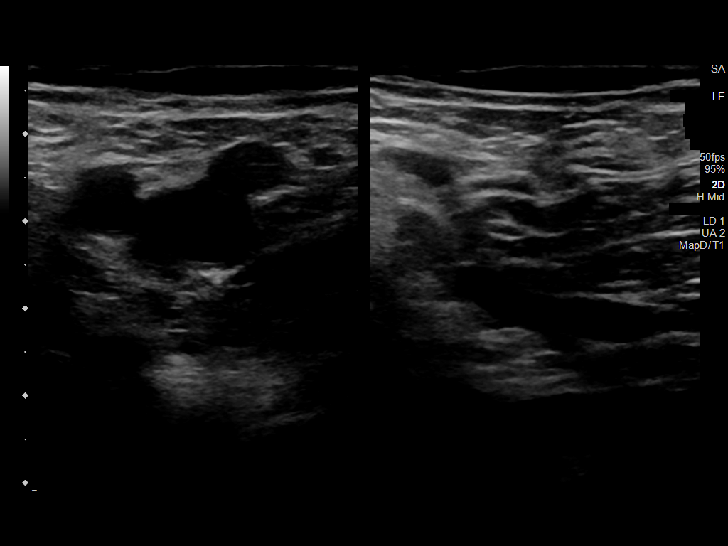
[im 29/29]
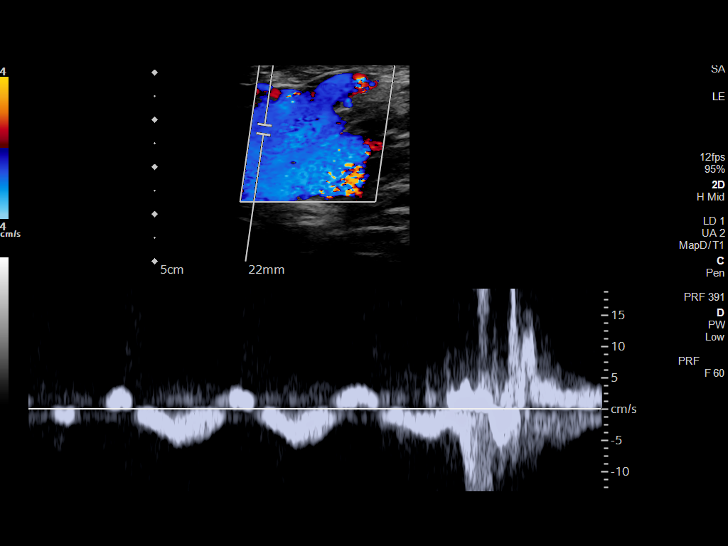

[14 of 24 positions shown; findings below may reference images not displayed]

FINDINGS: VENOUS

Normal compressibility of the common femoral, superficial femoral,
and popliteal veins, as well as the visualized calf veins.
Visualized portions of profunda femoral vein and great saphenous
vein unremarkable. No filling defects to suggest DVT on grayscale or
color Doppler imaging. Doppler waveforms show normal direction of
venous flow, normal respiratory plasticity and response to
augmentation.

Limited views of the contralateral common femoral vein are
unremarkable.

OTHER

None.

Limitations: none
IMPRESSION: Negative.
